# Patient Record
Sex: Female | Born: 1985 | Race: Black or African American | Hispanic: No | Marital: Single | Smoking: Never smoker
Health system: Southern US, Community
[De-identification: ages and names within clinical notes are randomized; demographics above are authoritative.]

## PROBLEM LIST (undated history)

## (undated) DIAGNOSIS — G43909 Migraine, unspecified, not intractable, without status migrainosus: Secondary | ICD-10-CM

## (undated) DIAGNOSIS — E66811 Obesity, class 1: Secondary | ICD-10-CM

## (undated) DIAGNOSIS — E7800 Pure hypercholesterolemia, unspecified: Secondary | ICD-10-CM

## (undated) DIAGNOSIS — R7303 Prediabetes: Secondary | ICD-10-CM

## (undated) DIAGNOSIS — E669 Obesity, unspecified: Secondary | ICD-10-CM

## (undated) HISTORY — DX: Obesity, unspecified: E66.9

## (undated) HISTORY — DX: Prediabetes: R73.03

## (undated) HISTORY — DX: Pure hypercholesterolemia, unspecified: E78.00

## (undated) HISTORY — PX: OTHER SURGICAL HISTORY: SHX169

## (undated) HISTORY — DX: Migraine, unspecified, not intractable, without status migrainosus: G43.909

## (undated) HISTORY — DX: Obesity, class 1: E66.811

---
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
## 2008-09-01 ENCOUNTER — Emergency Department (HOSPITAL_COMMUNITY): Admission: EM | Admit: 2008-09-01 | Discharge: 2008-09-01 | Payer: Self-pay | Admitting: Emergency Medicine

## 2010-05-14 ENCOUNTER — Emergency Department (HOSPITAL_COMMUNITY): Admission: EM | Admit: 2010-05-14 | Discharge: 2010-05-14 | Payer: Self-pay | Admitting: Emergency Medicine

## 2010-06-24 ENCOUNTER — Emergency Department (HOSPITAL_COMMUNITY): Admission: EM | Admit: 2010-06-24 | Discharge: 2010-06-24 | Payer: Self-pay | Admitting: Emergency Medicine
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
## 2010-10-22 ENCOUNTER — Emergency Department (HOSPITAL_COMMUNITY)
Admission: EM | Admit: 2010-10-22 | Discharge: 2010-10-22 | Payer: Self-pay | Source: Home / Self Care | Admitting: Family Medicine

## 2011-09-30 ENCOUNTER — Emergency Department (HOSPITAL_COMMUNITY)
Admission: EM | Admit: 2011-09-30 | Discharge: 2011-09-30 | Disposition: A | Discharge status: Home / Self Care | Payer: Self-pay | Attending: Emergency Medicine | Admitting: Emergency Medicine

## 2011-09-30 DIAGNOSIS — K0381 Cracked tooth: Secondary | ICD-10-CM | POA: Insufficient documentation

## 2011-09-30 DIAGNOSIS — S032XXA Dislocation of tooth, initial encounter: Secondary | ICD-10-CM

## 2011-09-30 DIAGNOSIS — K089 Disorder of teeth and supporting structures, unspecified: Secondary | ICD-10-CM | POA: Insufficient documentation

## 2011-09-30 DIAGNOSIS — K029 Dental caries, unspecified: Secondary | ICD-10-CM | POA: Insufficient documentation

## 2011-09-30 MED ORDER — AMOXICILLIN 500 MG PO CAPS: 500.0000 mg | ORAL_CAPSULE | Freq: Three times a day (TID) | ORAL | Status: AC

## 2011-09-30 MED ORDER — HYDROCODONE-ACETAMINOPHEN 5-325 MG PO TABS: 1.0000 | ORAL_TABLET | ORAL | Status: AC | PRN

## 2011-09-30 NOTE — ED Notes (Signed)
Rt. Facial swelling due to broken tooth.

## 2011-09-30 NOTE — ED Notes (Signed)
Pt here with c/o right facial swelling that started on Sunday.  Pt reports that she has a broken tooth on right lower side and wants to make sure that the area is not infected.  Pt reports the pain has improved and rates it an 8/10 at this time.

## 2011-09-30 NOTE — ED Provider Notes (Addendum)
History     CSN: [id number] Arrival date & time: 09/30/2011 11:41 AM   First MD Initiated Contact with Patient 09/30/11 1151      Chief Complaint  Patient presents with  . Dental Pain  ... complains of pain with broken tooth for several days. Has appointment with dentist. No fever, chills, stiff neck  (Consider location/radiation/quality/duration/timing/severity/associated sxs/prior treatment) HPI  History reviewed. No pertinent past medical history.  History reviewed. No pertinent past surgical history.  History reviewed. No pertinent family history.  History  Substance Use Topics  . Smoking status: Never Smoker   . Smokeless tobacco: Not on file  . Alcohol Use: Yes    OB History    Grav Para Term Preterm Abortions TAB SAB Ect Mult Living                  Review of Systems  All other systems reviewed and are negative.    Allergies  Review of patient's allergies indicates no known allergies.  Home Medications   Current Outpatient Rx  Name Route Sig Dispense Refill  . BENZOCAINE 10 % MT GEL Mouth/Throat Use as directed 1 application in the mouth or throat as needed. For tooth pain       BP 118/76  Pulse 96  Temp(Src) 98.9 F (37.2 C) (Oral)  Resp 16  Ht 5' (1.524 m)  Wt 165 lb (74.844 kg)  BMI 32.22 kg/m2  SpO2 99%  LMP 08/30/2011  Physical Exam  Nursing note and vitals reviewed. Constitutional: She is oriented to person, place, and time. She appears well-developed and well-nourished.  HENT:  Mouth/Throat: Dental caries present.         See diagram  Neck: Normal range of motion. Neck supple.  Neurological: She is alert and oriented to person, place, and time.  Skin: Skin is warm and dry.  Psychiatric: She has a normal mood and affect.    ED Course  Procedures (including critical care time)  Labs Reviewed - No data to display No results found.   No diagnosis found.    MDM  Rx antibiotics and pain meds. Refer to  dentist        Donnetta Hutching, MD 09/30/11 4098  Donnetta Hutching, MD 09/30/11 [phone]

## 2012-04-10 ENCOUNTER — Encounter (HOSPITAL_COMMUNITY): Payer: Self-pay | Admitting: Family Medicine

## 2012-04-10 ENCOUNTER — Emergency Department (HOSPITAL_COMMUNITY)
Admission: EM | Admit: 2012-04-10 | Discharge: 2012-04-10 | Disposition: A | Discharge status: Home / Self Care | Payer: Self-pay | Attending: Emergency Medicine | Admitting: Emergency Medicine

## 2012-04-10 DIAGNOSIS — F17200 Nicotine dependence, unspecified, uncomplicated: Secondary | ICD-10-CM | POA: Insufficient documentation

## 2012-04-10 DIAGNOSIS — B356 Tinea cruris: Secondary | ICD-10-CM

## 2012-04-10 MED ORDER — KETOCONAZOLE 2 % EX CREA
TOPICAL_CREAM | Freq: Two times a day (BID) | CUTANEOUS | Status: DC
  Administered 2012-04-10: 1 via TOPICAL
  Filled 2012-04-10: qty 15

## 2012-04-10 NOTE — ED Provider Notes (Signed)
History    This chart was scribed for Gavin Pound. Oletta Lamas, MD, MD by Smitty Pluck. The patient was seen in room TR04C and the patient's care was started at 2:06PM.   CSN: [id number]  Arrival date & time 04/10/12  1328   None     Chief Complaint  Patient presents with  . Rash    (Consider location/radiation/quality/duration/timing/severity/associated sxs/prior treatment) The history is provided by the patient.   Tracy Johnson is a 26 y.o. female who presents to the Emergency Department complaining of moderate rash in pelvic region onset 1 day ago. Pt denies itching and burning. Pt denies dysuria. Pt reports LMP was 02/21/12. Denies any other problems. Reports that she has tried new soap but rash is not present any other place on body.   History reviewed. No pertinent past medical history.  History reviewed. No pertinent past surgical history.  History reviewed. No pertinent family history.  History  Substance Use Topics  . Smoking status: Passive Smoker  . Smokeless tobacco: Not on file  . Alcohol Use: Yes    OB History    Grav Para Term Preterm Abortions TAB SAB Ect Mult Living                  Review of Systems  Constitutional: Negative for fever and chills.  HENT: Negative for congestion and rhinorrhea.   Gastrointestinal: Negative for nausea, vomiting and diarrhea.  Genitourinary: Negative for dysuria, hematuria, flank pain, vaginal bleeding, vaginal discharge, difficulty urinating and pelvic pain.  Skin: Positive for rash. Negative for color change and wound.    Allergies  Review of patient's allergies indicates no known allergies.  Home Medications  No current outpatient prescriptions on file.  BP 121/63  Pulse 89  Temp 98 F (36.7 C)  Resp 16  SpO2 98%  LMP 03/23/2012  Physical Exam  Nursing note and vitals reviewed. Constitutional: She is oriented to person, place, and time. She appears well-developed and well-nourished. No distress.  HENT:  Head:  Normocephalic and atraumatic.  Pulmonary/Chest: Effort normal. No respiratory distress.  Abdominal: Soft.  Genitourinary:       Right inguinal crease skin breakdown, slightly moist  Erythematous but not warm No pus drainage or fluctuance No lymphadenopathy   Neurological: She is alert and oriented to person, place, and time.  Skin: Skin is warm and dry. Rash noted.  Psychiatric: She has a normal Johnson and affect. Her behavior is normal.    ED Course  Procedures (including critical care time) DIAGNOSTIC STUDIES: Oxygen Saturation is 98% on room air, normal by my interpretation.    COORDINATION OF CARE: 2:10PM EDP discusses pt ED treatment with pt.    Labs Reviewed - No data to display No results found.   1. Tinea cruris       MDM  I personally performed the services described in this documentation, which was scribed in my presence. The recorded information has been reviewed and considered.         Gavin Pound. Iokepa Geffre, MD 04/10/12 1527

## 2012-04-10 NOTE — ED Notes (Signed)
Pt sts rash in pelvic region that she noticed yesterday. sts only on one side. Pt sts she did try a new body wash recently.

## 2012-04-10 NOTE — Discharge Instructions (Signed)
Jock Itch Jock itch is a fungal infection of the skin in the groin area. It is sometimes called "ringworm" even though it is not caused by a worm. A fungus is a type of germ that thrives in dark, damp places.  CAUSES  This infection may spread from:  A fungus infection elsewhere on the body (such as athlete's foot).   Sharing towels or clothing.  This infection is more common in:  Hot, humid climates.   People who wear tight-fitting clothing or wet bathing suits for long periods of time.   Athletes.   Overweight people.   People with diabetes.  SYMPTOMS  Jock itch causes the following symptoms:  Red, pink or brown rash in the groin. Rash may spread to the thighs, anus, and buttocks.   Itching.  DIAGNOSIS  Your caregiver may make the diagnosis by looking at the rash. Sometimes a skin scraping will be sent to test for fungus. Testing can be done either by looking under the microscope or by doing a culture (test to try to grow the fungus). A culture can take up to 2 weeks to come back. TREATMENT  Jock itch may be treated with:  Skin cream or ointment to kill fungus.   Medicine by mouth to kill fungus.   Skin cream or ointment to calm the itching.   Compresses or medicated powders to dry the infected skin.  HOME CARE INSTRUCTIONS   Be sure to treat the rash completely. Follow your caregiver's instructions. It can take a couple of weeks to treat. If you do not treat the infection long enough, the rash can come back.   Wear loose-fitting clothing.   Men should wear cotton boxer shorts.   Women should wear cotton underwear.   Avoid hot baths.   Dry the groin area well after bathing.  SEEK MEDICAL CARE IF:   Your rash is worse.   Your rash is spreading.   Your rash returns after treatment is finished.   Your rash is not gone in 4 weeks. Fungal infections are slow to respond to treatment. Some redness may remain for several weeks after the fungus is gone.  SEEK  IMMEDIATE MEDICAL CARE IF:  The area becomes red, warm, tender, and swollen.   You have a fever.  Document Released: 09/19/2002 Document Revised: 09/18/2011 Document Reviewed: 08/18/2008 [employer] Patient Information 2012 [employer]. 

## 2012-07-23 ENCOUNTER — Encounter (HOSPITAL_COMMUNITY): Payer: Self-pay | Admitting: *Deleted

## 2012-07-23 ENCOUNTER — Emergency Department (HOSPITAL_COMMUNITY): Payer: Self-pay

## 2012-07-23 ENCOUNTER — Emergency Department (HOSPITAL_COMMUNITY)
Admission: EM | Admit: 2012-07-23 | Discharge: 2012-07-23 | Disposition: A | Discharge status: Home / Self Care | Payer: Self-pay | Attending: Emergency Medicine | Admitting: Emergency Medicine

## 2012-07-23 DIAGNOSIS — W230XXA Caught, crushed, jammed, or pinched between moving objects, initial encounter: Secondary | ICD-10-CM | POA: Insufficient documentation

## 2012-07-23 DIAGNOSIS — Y92009 Unspecified place in unspecified non-institutional (private) residence as the place of occurrence of the external cause: Secondary | ICD-10-CM | POA: Insufficient documentation

## 2012-07-23 DIAGNOSIS — S6990XA Unspecified injury of unspecified wrist, hand and finger(s), initial encounter: Secondary | ICD-10-CM | POA: Insufficient documentation

## 2012-07-23 DIAGNOSIS — S60029A Contusion of unspecified index finger without damage to nail, initial encounter: Secondary | ICD-10-CM

## 2012-07-23 DIAGNOSIS — S6980XA Other specified injuries of unspecified wrist, hand and finger(s), initial encounter: Secondary | ICD-10-CM | POA: Insufficient documentation

## 2012-07-23 NOTE — ED Provider Notes (Signed)
History     CSN: [id number]  Arrival date & time 07/23/12  1308   First MD Initiated Contact with Patient 07/23/12 [phone]      Chief Complaint  Patient presents with  . Hand Injury    (Consider location/radiation/quality/duration/timing/severity/associated sxs/prior treatment) Patient is a 26 y.o. female presenting with hand injury. The history is provided by the patient.  Hand Injury  Incident onset: 2 days ago. The incident occurred at home. Injury mechanism: door closed on the left index finger. The quality of the pain is described as throbbing. The pain is moderate. The pain has been constant since the incident. The symptoms are aggravated by movement, use and palpation. She has tried ice for the symptoms. The treatment provided no relief.    History reviewed. No pertinent past medical history.  History reviewed. No pertinent past surgical history.  History reviewed. No pertinent family history.  History  Substance Use Topics  . Smoking status: Passive Smoke Exposure - Never Smoker  . Smokeless tobacco: Not on file  . Alcohol Use: Yes    OB History    Grav Para Term Preterm Abortions TAB SAB Ect Mult Living                  Review of Systems  All other systems reviewed and are negative.    Allergies  Review of patient's allergies indicates no known allergies.  Home Medications  No current outpatient prescriptions on file.  BP 128/73  Pulse 87  Temp 98.3 F (36.8 C) (Oral)  Resp 16  SpO2 99%  LMP 06/28/2012  Physical Exam  Nursing note and vitals reviewed. Constitutional: She is oriented to person, place, and time. She appears well-developed and well-nourished.  HENT:  Head: Normocephalic and atraumatic.  Neck: Normal range of motion. Neck supple.  Musculoskeletal:       The left index finger appears grossly normal.  There is no swelling.  There is pain with range of motion and palpation.    Neurological: She is alert and oriented to person, place,  and time.  Skin: Skin is warm and dry.    ED Course  Procedures (including critical care time)  Labs Reviewed - No data to display Dg Hand Complete Left  07/23/2012  *RADIOLOGY REPORT*  Clinical Data: 26 year old female status post blunt trauma with pain.  LEFT HAND - COMPLETE 3+ VIEW  Comparison: None.  Findings: Bone mineralization is within normal limits.  Distal radius and ulna intact.  Carpal bone alignment within normal limits.  Joint spaces preserved.  No fracture or dislocation.  IMPRESSION: No acute fracture or dislocation identified about the left hand.   Original Report Authenticated By: Harley Hallmark, M.D.      No diagnosis found.    MDM  Will treat with a splint, rest, follow up prn if not improving.          Geoffery Lyons, MD 07/23/12 [phone]

## 2012-07-23 NOTE — ED Notes (Signed)
The patient is AOx4 and comfortable with her discharge instructions. 

## 2012-07-23 NOTE — ED Notes (Signed)
Patient transported to X-ray 

## 2012-07-23 NOTE — ED Notes (Signed)
Pt states a couple of days ago she caught her left pointer finger in a swinging door. Pt states she tried ice and OTC pain medications and the swelling has gone down but when she tried to bend her finger she has severe pain. CNS intact cap refill less than 3 sec

## 2012-11-06 ENCOUNTER — Emergency Department (HOSPITAL_COMMUNITY)
Admission: EM | Admit: 2012-11-06 | Discharge: 2012-11-06 | Disposition: A | Discharge status: Home / Self Care | Payer: BC Managed Care – PPO | Attending: Emergency Medicine | Admitting: Emergency Medicine

## 2012-11-06 ENCOUNTER — Encounter (HOSPITAL_COMMUNITY): Payer: Self-pay | Admitting: Neurology

## 2012-11-06 DIAGNOSIS — K089 Disorder of teeth and supporting structures, unspecified: Secondary | ICD-10-CM | POA: Insufficient documentation

## 2012-11-06 DIAGNOSIS — N63 Unspecified lump in unspecified breast: Secondary | ICD-10-CM | POA: Insufficient documentation

## 2012-11-06 MED ORDER — CEPHALEXIN 500 MG PO CAPS: 500.0000 mg | ORAL_CAPSULE | Freq: Two times a day (BID) | ORAL | Status: DC

## 2012-11-06 MED ORDER — HYDROCODONE-ACETAMINOPHEN 5-325 MG PO TABS
1.0000 | ORAL_TABLET | Freq: Once | ORAL | Status: AC
  Administered 2012-11-06: 1 via ORAL
  Filled 2012-11-06: qty 1

## 2012-11-06 MED ORDER — HYDROCODONE-ACETAMINOPHEN 5-325 MG PO TABS: 2.0000 | ORAL_TABLET | ORAL | Status: DC | PRN

## 2012-11-06 MED ORDER — CEPHALEXIN 250 MG PO CAPS
500.0000 mg | ORAL_CAPSULE | Freq: Once | ORAL | Status: AC
  Administered 2012-11-06: 500 mg via ORAL
  Filled 2012-11-06: qty 2

## 2012-11-06 NOTE — ED Notes (Signed)
No answer x2 

## 2012-11-06 NOTE — ED Notes (Signed)
Pt reports bump to right breast,  Right lower toothache. Bump been present for several weeks, toothache just occurred. Pt in NAD

## 2012-11-06 NOTE — ED Provider Notes (Addendum)
History     CSN: [id number]  Arrival date & time 11/06/12  1118   First MD Initiated Contact with Patient 11/06/12 1130      No chief complaint on file.  Chief complaint "I have a knot on my breast" (Consider location/radiation/quality/duration/timing/severity/associated sxs/prior treatment) HPI Complains of left upper toothache at left upper premolar for possibly one week which has improved, without treatment. Patient also complains of a painful "knot" on her right breast for approximately one week. Pain is mild she's been treating herself with Tylenol, without relief no fever no shortness of breath pain is worse with wearing a bra improved with taking cough no fever no shortness of breath no other associated symptoms No past medical history on file. Past medical history negative No past surgical history on file.  No family history on file.  History  Substance Use Topics  . Smoking status: Passive Smoke Exposure - Never Smoker  . Smokeless tobacco: Not on file  . Alcohol Use: Yes    OB History    Grav Para Term Preterm Abortions TAB SAB Ect Mult Living                  Review of Systems  Constitutional: Negative.   HENT: Positive for dental problem.   Respiratory: Negative.   Cardiovascular: Negative.   Gastrointestinal: Negative.   Musculoskeletal: Negative.   Skin: Positive for wound.       Pain at right breast  Neurological: Negative.   Hematological: Negative.   Psychiatric/Behavioral: Negative.   All other systems reviewed and are negative.    Allergies  Review of patient's allergies indicates no known allergies.  Home Medications  No current outpatient prescriptions on file.  BP 127/76  Pulse 97  Temp 98.9 F (37.2 C) (Oral)  Resp 18  SpO2 100%  Physical Exam  Nursing note and vitals reviewed. Constitutional: She is oriented to person, place, and time. She appears well-developed and well-nourished. No distress.  HENT:  Head: Normocephalic and  atraumatic.  Right Ear: External ear normal.  Left Ear: External ear normal.       Left upper pre-molar with dental Carry mildly tender. No fluctuance or swelling of the gingiva. No trismus  Eyes: Conjunctivae normal are normal. Pupils are equal, round, and reactive to light.  Neck: Neck supple. No tracheal deviation present. No thyromegaly present.  Cardiovascular: Normal rate and regular rhythm.   No murmur heard. Pulmonary/Chest: Effort normal and breath sounds normal.        lower medial quadrant of right breast has a pea-sized tender nodule which is easily movable. No axillary nodes nipple is normal  Abdominal: Soft. Bowel sounds are normal. She exhibits no distension. There is no tenderness.  Musculoskeletal: Normal range of motion. She exhibits no edema and no tenderness.  Lymphadenopathy:    She has no cervical adenopathy.  Neurological: She is alert and oriented to person, place, and time. Coordination normal.  Skin: Skin is warm and dry. No rash noted.  Psychiatric: She has a normal mood and affect.    ED Course  Procedures (including critical care time)  Labs Reviewed - No data to display No results found.   No diagnosis found.    MDM  I suspect patient has a small breast abscess. Case discussed with Dr. Eppie Gibson plan Prescription Keflex, Vicodin. Patient breast center 11/08/2012 for followup She has a scheduled appointment with her dentist for 11/29/2012 Diagnosis #1 breast mass #2 dental carry  Doug Sou, MD 11/06/12 1154  Doug Sou, MD 11/06/12 1212

## 2012-11-08 ENCOUNTER — Other Ambulatory Visit: Payer: Self-pay | Admitting: Emergency Medicine

## 2012-11-08 ENCOUNTER — Ambulatory Visit
Admission: RE | Admit: 2012-11-08 | Discharge: 2012-11-08 | Disposition: A | Discharge status: Home / Self Care | Payer: BC Managed Care – PPO | Source: Ambulatory Visit | Attending: Emergency Medicine | Admitting: Emergency Medicine

## 2012-11-08 DIAGNOSIS — N63 Unspecified lump in unspecified breast: Secondary | ICD-10-CM

## 2012-11-08 DIAGNOSIS — N644 Mastodynia: Secondary | ICD-10-CM

## 2012-11-22 ENCOUNTER — Other Ambulatory Visit: Payer: Self-pay | Admitting: Internal Medicine

## 2012-11-22 DIAGNOSIS — N63 Unspecified lump in unspecified breast: Secondary | ICD-10-CM

## 2012-11-22 DIAGNOSIS — N644 Mastodynia: Secondary | ICD-10-CM

## 2012-12-31 ENCOUNTER — Emergency Department (HOSPITAL_COMMUNITY)
Admission: EM | Admit: 2012-12-31 | Discharge: 2012-12-31 | Disposition: A | Discharge status: Home / Self Care | Payer: BC Managed Care – PPO | Attending: Emergency Medicine | Admitting: Emergency Medicine

## 2012-12-31 ENCOUNTER — Encounter (HOSPITAL_COMMUNITY): Payer: Self-pay

## 2012-12-31 DIAGNOSIS — J029 Acute pharyngitis, unspecified: Secondary | ICD-10-CM

## 2012-12-31 DIAGNOSIS — J3489 Other specified disorders of nose and nasal sinuses: Secondary | ICD-10-CM | POA: Insufficient documentation

## 2012-12-31 NOTE — ED Notes (Signed)
Pt presents onset of sore throat x 2 days that pt describes as "itchy" but awoke this morning with "burning" in throat.  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 Pt reports "stuffy" nose.

## 2012-12-31 NOTE — ED Provider Notes (Signed)
History     CSN: [id number]  Arrival date & time 12/31/12  1127   First MD Initiated Contact with Patient 12/31/12 1243      No chief complaint on file.   (Consider location/radiation/quality/duration/timing/severity/associated sxs/prior treatment) Patient is a 27 y.o. female presenting with pharyngitis. The history is provided by the patient.  Sore Throat This is a new problem. Episode onset: two days. The problem occurs constantly. The problem has been gradually worsening. Associated symptoms include congestion and a sore throat. Pertinent negatives include no abdominal pain, anorexia, chills, coughing, fever, neck pain, rash, swollen glands or vomiting. The symptoms are aggravated by swallowing. She has tried nothing for the symptoms.    History reviewed. No pertinent past medical history.  History reviewed. No pertinent past surgical history.  History reviewed. No pertinent family history.  History  Substance Use Topics  . Smoking status: Passive Smoke Exposure - Never Smoker  . Smokeless tobacco: Not on file  . Alcohol Use: Yes    OB History   Grav Para Term Preterm Abortions TAB SAB Ect Mult Living                  Review of Systems  Constitutional: Negative for fever and chills.  HENT: Positive for congestion and sore throat. Negative for drooling, trouble swallowing, neck pain, voice change, postnasal drip and sinus pressure.   Respiratory: Negative for cough.   Gastrointestinal: Negative for vomiting, abdominal pain and anorexia.  Skin: Negative for rash.    Allergies  Review of patient's allergies indicates no known allergies.  Home Medications  No current outpatient prescriptions on file.  BP 110/60  Pulse 71  Temp(Src) 98.3 F (36.8 C) (Oral)  Resp 14  SpO2 98%  LMP 12/06/2012  Physical Exam  Nursing note and vitals reviewed. Constitutional: She appears well-developed and well-nourished. No distress.  HENT:  Head: Normocephalic and  atraumatic. No trismus in the jaw.  Right Ear: Tympanic membrane, external ear and ear canal normal.  Left Ear: Tympanic membrane, external ear and ear canal normal.  Nose: Mucosal edema and rhinorrhea present. Right sinus exhibits no maxillary sinus tenderness and no frontal sinus tenderness. Left sinus exhibits no maxillary sinus tenderness and no frontal sinus tenderness.  Mouth/Throat: Uvula is midline and mucous membranes are normal. No edematous. Posterior oropharyngeal erythema present. No oropharyngeal exudate, posterior oropharyngeal edema or tonsillar abscesses.  Patient handling secretions well.  No drooling.  Normal voice phonation  Neck: Normal range of motion. Neck supple.  Cardiovascular: Normal rate, regular rhythm and normal heart sounds.   Pulmonary/Chest: Effort normal and breath sounds normal.  Lymphadenopathy:    She has no cervical adenopathy.  Neurological: She is alert.  Skin: Skin is warm and dry. She is not diaphoretic.  Psychiatric: She has a normal mood and affect.    ED Course  Procedures (including critical care time)  Labs Reviewed  RAPID STREP SCREEN   No results found.   1. Viral pharyngitis       MDM  Patient presenting with sore throat x 2 days.  Rapid strep negative.   Uvula midline.  No evidence of peritonsillar abscess.  Patient discharged home.  Return precautions given.        Pascal Lux Cash, PA-C 12/31/12 [phone]

## 2012-12-31 NOTE — ED Provider Notes (Signed)
Medical screening examination/treatment/procedure(s) were performed by non-physician practitioner and as supervising physician I was immediately available for consultation/collaboration.  Flint Melter, MD 12/31/12 [phone]

## 2013-02-22 ENCOUNTER — Emergency Department (HOSPITAL_COMMUNITY)
Admission: EM | Admit: 2013-02-22 | Discharge: 2013-02-22 | Disposition: A | Discharge status: Home / Self Care | Payer: BC Managed Care – PPO | Source: Home / Self Care | Attending: Emergency Medicine | Admitting: Emergency Medicine

## 2013-02-22 ENCOUNTER — Encounter (HOSPITAL_COMMUNITY): Payer: Self-pay | Admitting: *Deleted

## 2013-02-22 ENCOUNTER — Other Ambulatory Visit (HOSPITAL_COMMUNITY)
Admission: RE | Admit: 2013-02-22 | Discharge: 2013-02-22 | Disposition: A | Discharge status: Home / Self Care | Payer: BC Managed Care – PPO | Source: Ambulatory Visit | Attending: Emergency Medicine | Admitting: Emergency Medicine

## 2013-02-22 DIAGNOSIS — N760 Acute vaginitis: Secondary | ICD-10-CM

## 2013-02-22 DIAGNOSIS — Z113 Encounter for screening for infections with a predominantly sexual mode of transmission: Secondary | ICD-10-CM | POA: Insufficient documentation

## 2013-02-22 LAB — POCT URINALYSIS DIP (DEVICE)
Nitrite: NEGATIVE
Protein, ur: NEGATIVE mg/dL
pH: 6 (ref 5.0–8.0)

## 2013-02-22 MED ORDER — FLUCONAZOLE 150 MG PO TABS: 150.0000 mg | ORAL_TABLET | Freq: Once | ORAL | Status: DC

## 2013-02-22 NOTE — ED Notes (Signed)
Call back number for lab issues verified 

## 2013-02-22 NOTE — ED Notes (Signed)
Pt  Reports  A  White      Vaginal   Discharge   And  Itching         -      She  Tried  Monistat          Which  Did        Not  Clear   No  Pain      No  Bleeding          Frequent  Urination

## 2013-02-22 NOTE — ED Provider Notes (Signed)
Chief Complaint:   Chief Complaint  Patient presents with  . Vaginal Discharge    History of Present Illness:   Tracy Johnson is a 27 year old female who has had a one half week history of a whitish, itchy vaginal discharge. She denies any odor. There is no vulvar, vaginal, or pelvic pain. She tried Monistat without any improvement. She has a history of yeast infections. Her menses have been irregular, last menstrual period being the end of April. She is sexually active with consistent use of condoms. She denies fever, chills, nausea, vomiting, or urinary symptoms. No prior history of GYN problems or STDs.  Review of Systems:  Other than noted above, the patient denies any of the following symptoms: Systemic:  No fever, chills, sweats, or weight loss. GI:  No abdominal pain, nausea, anorexia, vomiting, diarrhea, constipation, melena or hematochezia. GU:  No dysuria, frequency, urgency, hematuria, vaginal discharge, itching, or abnormal vaginal bleeding. Skin:  No rash or itching.  PMFSH:  Past medical history, family history, social history, meds, and allergies were reviewed.   Physical Exam:   Vital signs:  BP 130/76  Pulse 72  Temp(Src) 98.6 F (37 C) (Oral)  SpO2 100%  LMP 02/11/2013 General:  Alert, oriented and in no distress. Lungs:  Breath sounds clear and equal bilaterally.  No wheezes, rales or rhonchi. Heart:  Regular rhythm.  No gallops or murmers. Abdomen:  Soft, flat and non-distended.  No organomegaly or mass.  No tenderness, guarding or rebound.  Bowel sounds normally active. Pelvic exam:  Normal external genitalia, there is a scant, whitish vaginal discharge without any odor. No pain on cervical motion. Uterus was normal in size and shape and nontender. No adnexal masses or tenderness. Skin:  Clear, warm and dry.  Labs:   Results for orders placed during the hospital encounter of 02/22/13  POCT URINALYSIS DIP (DEVICE)      Result Value Range   Glucose, UA NEGATIVE   NEGATIVE mg/dL   Bilirubin Urine NEGATIVE  NEGATIVE   Ketones, ur NEGATIVE  NEGATIVE mg/dL   Specific Gravity, Urine 1.025  1.005 - 1.030   Hgb urine dipstick TRACE (*) NEGATIVE   pH 6.0  5.0 - 8.0   Protein, ur NEGATIVE  NEGATIVE mg/dL   Urobilinogen, UA 0.2  0.0 - 1.0 mg/dL   Nitrite NEGATIVE  NEGATIVE   Leukocytes, UA SMALL (*) NEGATIVE  POCT PREGNANCY, URINE      Result Value Range   Preg Test, Ur NEGATIVE  NEGATIVE    DNA probes were obtained for gonorrhea, Chlamydia, Trichomonas, Gardnerella, Candida. Results are pending at this time and we'll call patient about any positive results.  Assessment:  The encounter diagnosis was Vaginitis.  Probably Candida.  Plan:   1.  The following meds were prescribed:   Discharge Medication List as of 02/22/2013 11:33 AM    START taking these medications   Details  fluconazole (DIFLUCAN) 150 MG tablet Take 1 tablet (150 mg total) by mouth once., Starting 02/22/2013, Normal       2.  The patient was instructed in symptomatic care and handouts were given. 3.  The patient was told to return if becoming worse in any way, if no better in 3 or 4 days, and given some red flag symptoms such as pelvic pain or fever that would indicate earlier return. 4.  Follow up  Here if no better in one to 2 weeks.    Reuben Likes, MD 02/22/13 2135

## 2013-02-24 ENCOUNTER — Telehealth (HOSPITAL_COMMUNITY): Payer: Self-pay | Admitting: Emergency Medicine

## 2013-02-24 MED ORDER — METRONIDAZOLE 500 MG PO TABS: 500.0000 mg | ORAL_TABLET | Freq: Two times a day (BID) | ORAL | Status: DC

## 2013-02-24 NOTE — ED Notes (Signed)
Her DNA probe was positive for Trichomonas, all others were negative. Will treat with metronidazole 500 mg twice a day for one week.  Reuben Likes, MD 02/24/13 1640

## 2013-02-24 NOTE — Telephone Encounter (Signed)
Message copied by Reuben Likes on Thu Feb 24, 2013  4:40 PM ------      Message from: Vassie Moselle      Created: Thu Feb 24, 2013  3:22 PM      Regarding: labs       Trich pos. Rest of labs neg. Needs treatment.      Vassie Moselle      02/24/2013       ------

## 2013-02-25 ENCOUNTER — Telehealth (HOSPITAL_COMMUNITY): Payer: Self-pay | Admitting: *Deleted

## 2013-02-25 NOTE — ED Notes (Signed)
I called pt. Pt. verified x 2 and given results.  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.  Pt. instructed  to notify her partner to be treated with Flagyl, no sex until you have finished your medication and your partner has been treated and to practice safe sex.  Pt. instructed to no alcohol while taking this medication.  Pt. called back a few minutes later and asked if her partner is supposed to take some of her medication.  I told her no, he needs to see the doctor, tell him he was exposed to trich and get his own Rx. Pt. voiced understanding. Vassie Moselle 02/25/2013

## 2013-02-28 ENCOUNTER — Other Ambulatory Visit: Payer: Self-pay | Admitting: Obstetrics and Gynecology

## 2013-08-11 ENCOUNTER — Encounter (HOSPITAL_COMMUNITY): Payer: Self-pay | Admitting: Emergency Medicine

## 2013-08-11 ENCOUNTER — Emergency Department (HOSPITAL_COMMUNITY)
Admission: EM | Admit: 2013-08-11 | Discharge: 2013-08-12 | Disposition: A | Discharge status: Home / Self Care | Payer: BC Managed Care – PPO | Attending: Emergency Medicine | Admitting: Emergency Medicine

## 2013-08-11 DIAGNOSIS — Z3202 Encounter for pregnancy test, result negative: Secondary | ICD-10-CM | POA: Insufficient documentation

## 2013-08-11 DIAGNOSIS — Z79899 Other long term (current) drug therapy: Secondary | ICD-10-CM | POA: Insufficient documentation

## 2013-08-11 DIAGNOSIS — M5430 Sciatica, unspecified side: Secondary | ICD-10-CM | POA: Insufficient documentation

## 2013-08-11 DIAGNOSIS — M5431 Sciatica, right side: Secondary | ICD-10-CM

## 2013-08-11 DIAGNOSIS — IMO0002 Reserved for concepts with insufficient information to code with codable children: Secondary | ICD-10-CM | POA: Insufficient documentation

## 2013-08-11 NOTE — ED Notes (Signed)
R flank pain radiating to R thigh for several days

## 2013-08-12 LAB — URINALYSIS, ROUTINE W REFLEX MICROSCOPIC
Bilirubin Urine: NEGATIVE
Hgb urine dipstick: NEGATIVE
Protein, ur: NEGATIVE mg/dL
Urobilinogen, UA: 1 mg/dL (ref 0.0–1.0)

## 2013-08-12 MED ORDER — PREDNISONE 20 MG PO TABS
60.0000 mg | ORAL_TABLET | Freq: Once | ORAL | Status: AC
  Administered 2013-08-12: 60 mg via ORAL
  Filled 2013-08-12: qty 3

## 2013-08-12 MED ORDER — PREDNISONE 20 MG PO TABS: 60.0000 mg | ORAL_TABLET | Freq: Every day | ORAL | Status: DC

## 2013-08-12 MED ORDER — CYCLOBENZAPRINE HCL 10 MG PO TABS: 10.0000 mg | ORAL_TABLET | Freq: Two times a day (BID) | ORAL | Status: DC | PRN

## 2013-08-12 NOTE — ED Notes (Addendum)
Pt reports lower back pain radiating to R upper leg for several days, denies injury or strain. Denies history of UTI, still has appendix.

## 2013-08-12 NOTE — ED Provider Notes (Signed)
Medical screening examination/treatment/procedure(s) were performed by non-physician practitioner and as supervising physician I was immediately available for consultation/collaboration.    Jestine Bicknell M Lalitha Ilyas, MD 08/12/13 0700 

## 2013-08-12 NOTE — ED Provider Notes (Signed)
CSN: [id number]     Arrival date & time 08/11/13  2350 History   None    Chief Complaint  Patient presents with  . Back Pain   (Consider location/radiation/quality/duration/timing/severity/associated sxs/prior Treatment) HPI Comments: Patient is a 27 year old female presented to the emergency department for 3 days gradually worsening right-sided lower back pain with radiation down right leg. She describes her pain as sharp and burning in nature. Patient states her pain began at work while lifting a heavy object. Patient states she's tried symptomatic care such as warm baths stretching with minimal relief. She denies any aggravating factors. She denies any fevers, chills, bladder bowel incontinence, numbness or weakness, history of cancer, history of IV drug abuse.  Patient is a 27 y.o. female presenting with back pain.  Back Pain   History reviewed. No pertinent past medical history. History reviewed. No pertinent past surgical history. History reviewed. No pertinent family history. History  Substance Use Topics  . Smoking status: Passive Smoke Exposure - Never Smoker  . Smokeless tobacco: Not on file  . Alcohol Use: Yes   OB History   Grav Para Term Preterm Abortions TAB SAB Ect Mult Living                 Review of Systems  Musculoskeletal: Positive for back pain.  All other systems reviewed and are negative.    Allergies  Review of patient's allergies indicates no known allergies.  Home Medications   Current Outpatient Rx  Name  Route  Sig  Dispense  Refill  . GILDESS FE 1/20 1-20 MG-MCG tablet   Oral   Take 1 tablet by mouth daily.         . cyclobenzaprine (FLEXERIL) 10 MG tablet   Oral   Take 1 tablet (10 mg total) by mouth 2 (two) times daily as needed for muscle spasms.   20 tablet   0   . predniSONE (DELTASONE) 20 MG tablet   Oral   Take 3 tablets (60 mg total) by mouth daily.   12 tablet   0    BP 128/84  Pulse 78  Temp(Src) 97.2 F (36.2 C)  (Oral)  Resp 14  SpO2 100%  LMP 07/24/2013 Physical Exam  Constitutional: She is oriented to person, place, and time. She appears well-developed and well-nourished. No distress.  HENT:  Head: Normocephalic and atraumatic.  Right Ear: External ear normal.  Left Ear: External ear normal.  Nose: Nose normal.  Eyes: Conjunctivae and EOM are normal. Pupils are equal, round, and reactive to light.  Neck: Neck supple.  Cardiovascular: Normal rate, regular rhythm, normal heart sounds and intact distal pulses.   Pulmonary/Chest: Effort normal and breath sounds normal.  Abdominal: Soft. Bowel sounds are normal. She exhibits no distension. There is no tenderness. There is no rigidity, no rebound, no guarding and no CVA tenderness.  Musculoskeletal:       Cervical back: Normal.       Thoracic back: Normal.       Lumbar back: She exhibits tenderness and spasm. She exhibits normal range of motion, no bony tenderness, no swelling, no edema, no deformity, no laceration, no pain and normal pulse.  Neurological: She is alert and oriented to person, place, and time. She has normal strength. No cranial nerve deficit or sensory deficit. Gait normal. GCS eye subscore is 4. GCS verbal subscore is 5. GCS motor subscore is 6.  No pronator drift. Bilateral heel-knee-shin intact.  Skin: Skin is warm and dry.  She is not diaphoretic.    ED Course  Procedures (including critical care time) Medications  predniSONE (DELTASONE) tablet 60 mg (60 mg Oral Given 08/12/13 0129)    Labs Review Labs Reviewed  URINALYSIS, ROUTINE W REFLEX MICROSCOPIC - Abnormal; Notable for the following:    APPearance CLOUDY (*)    All other components within normal limits  POCT PREGNANCY, URINE   Imaging Review No results found.  EKG Interpretation   None       MDM   1. Sciatica, right    Afebrile, NAD, non-toxic appearing, AAOx4. Patient with back pain.  No neurological deficits and normal neuro exam.  Patient can walk  but states is painful.  No loss of bowel or bladder control.  No concern for cauda equina.  No fever, night sweats, weight loss, h/o cancer, IVDU.  RICE protocol and pain medicine indicated and discussed with patient. Return precautions discussed. Patient is agreeable to plan. Patient is stable at time of discharge        Jeannetta Ellis, PA-C 08/12/13 4098

## 2014-04-25 ENCOUNTER — Other Ambulatory Visit: Payer: Self-pay | Admitting: Obstetrics and Gynecology

## 2014-04-26 LAB — CYTOLOGY - PAP

## 2014-05-28 IMAGING — CR DG HAND COMPLETE 3+V*L*
3 series · 3 of 3 positions shown · non-contrast
Comparison: None.

CLINICAL DATA: 26-year-old female status post blunt trauma with
pain.

LEFT HAND - COMPLETE 3+ VIEW

[x hand pa left]
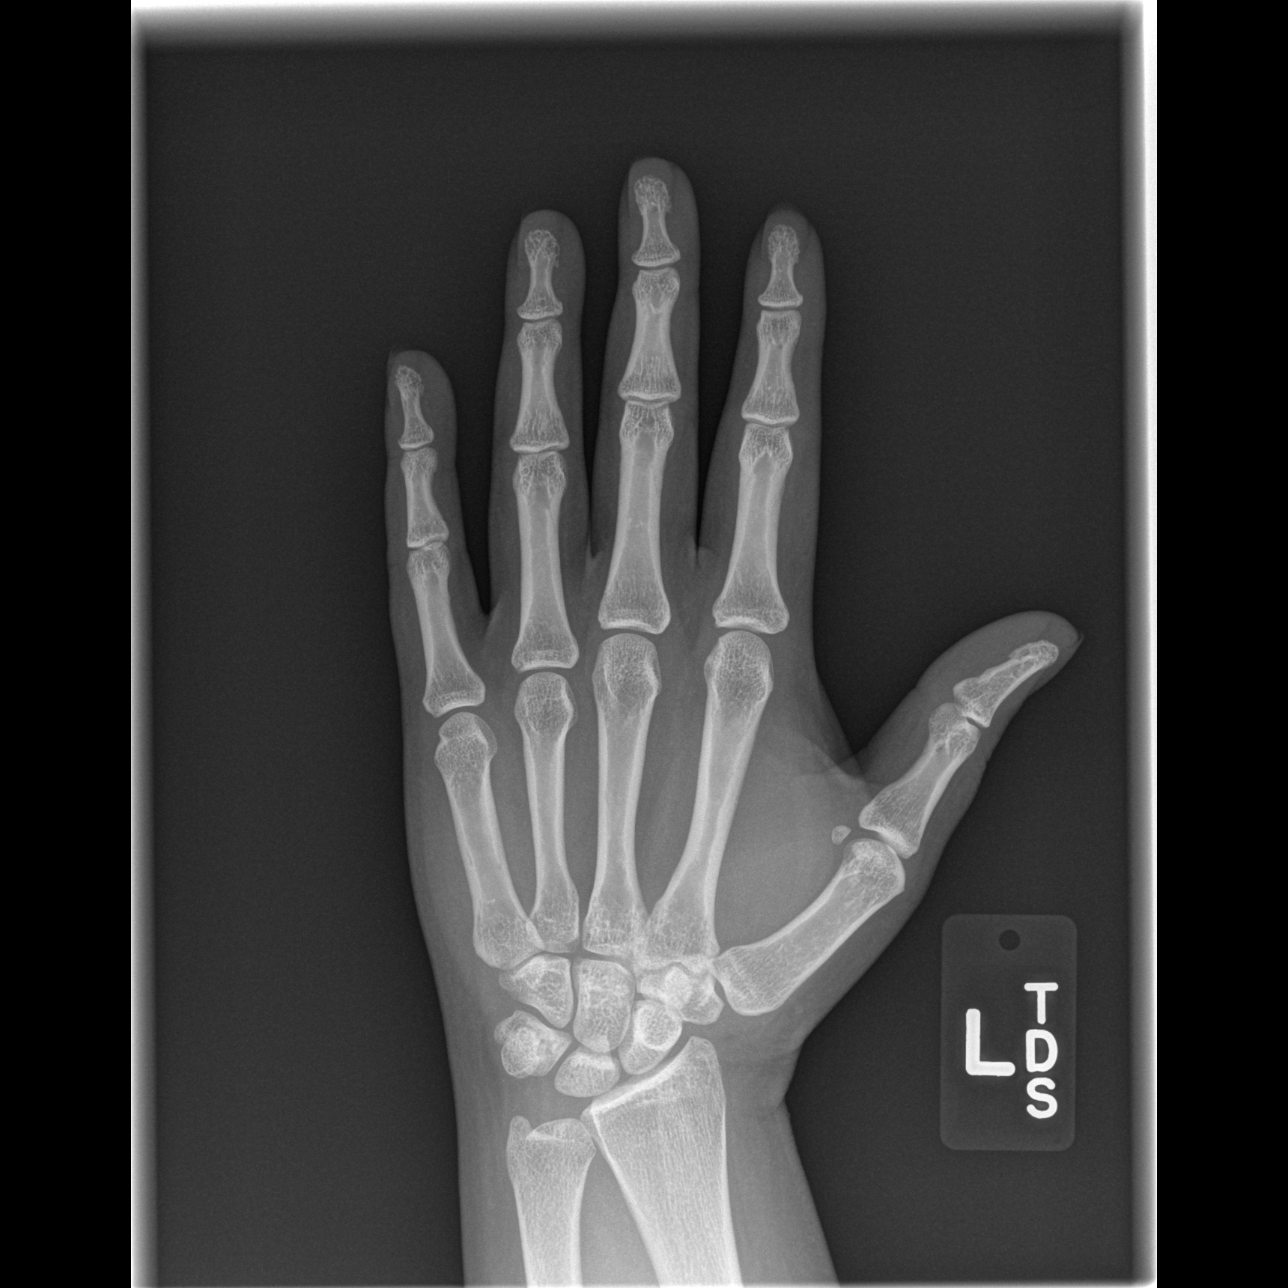

[x hand oblique left]
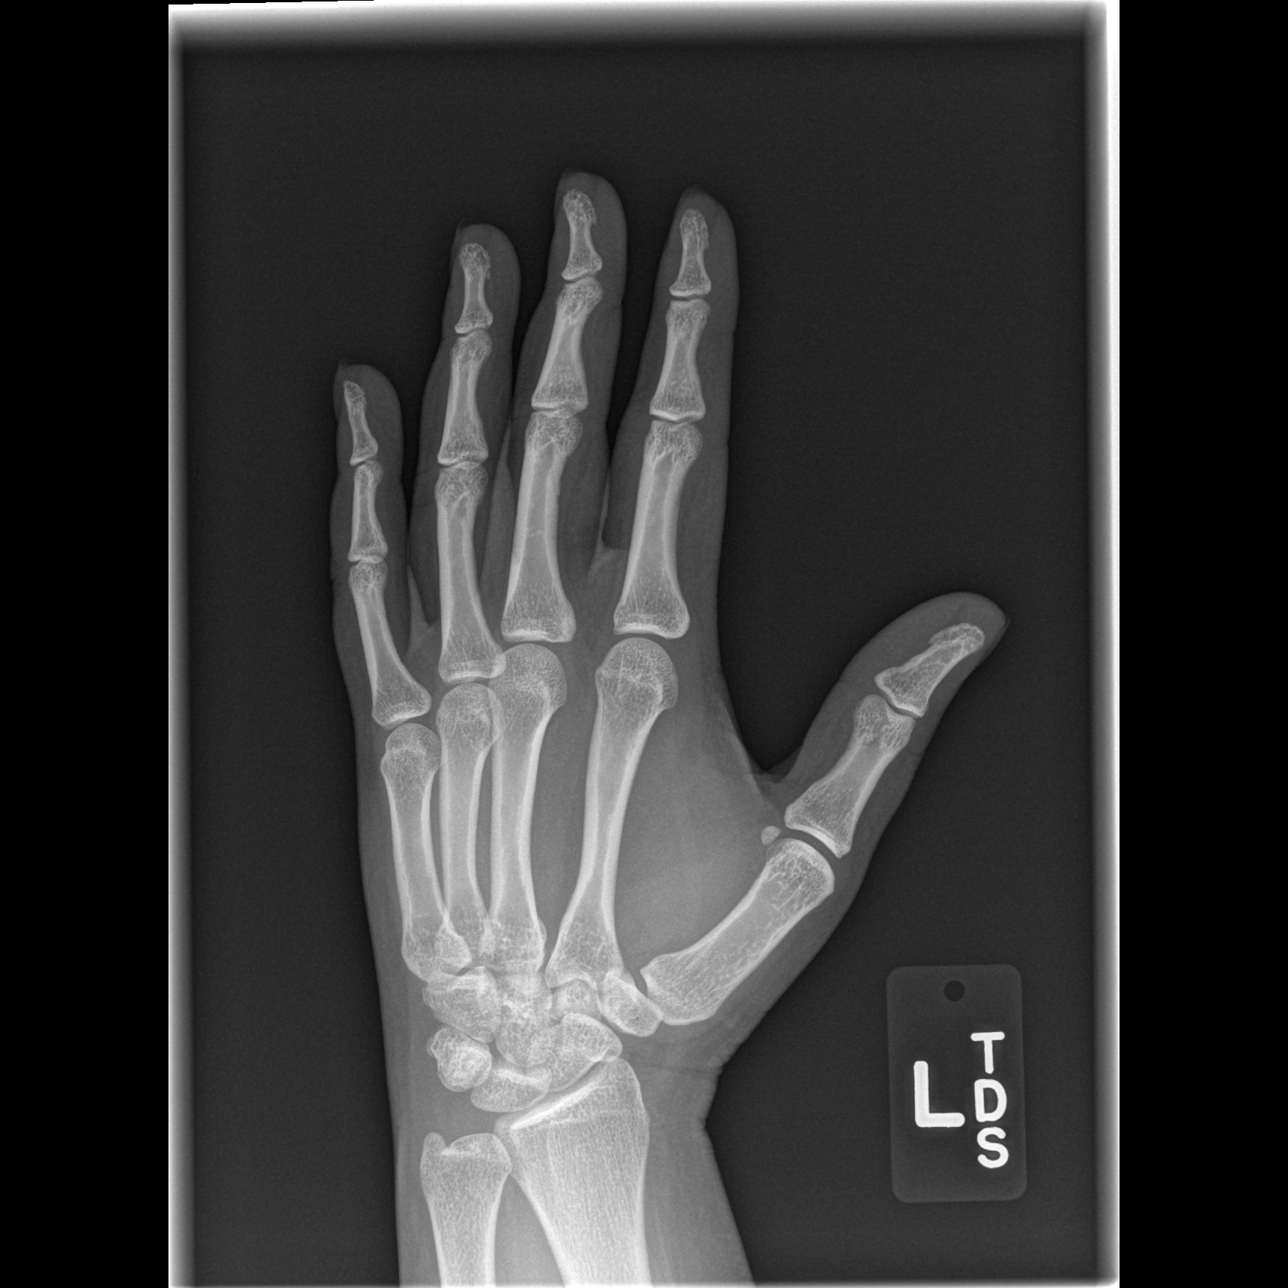

[x hand lat left]
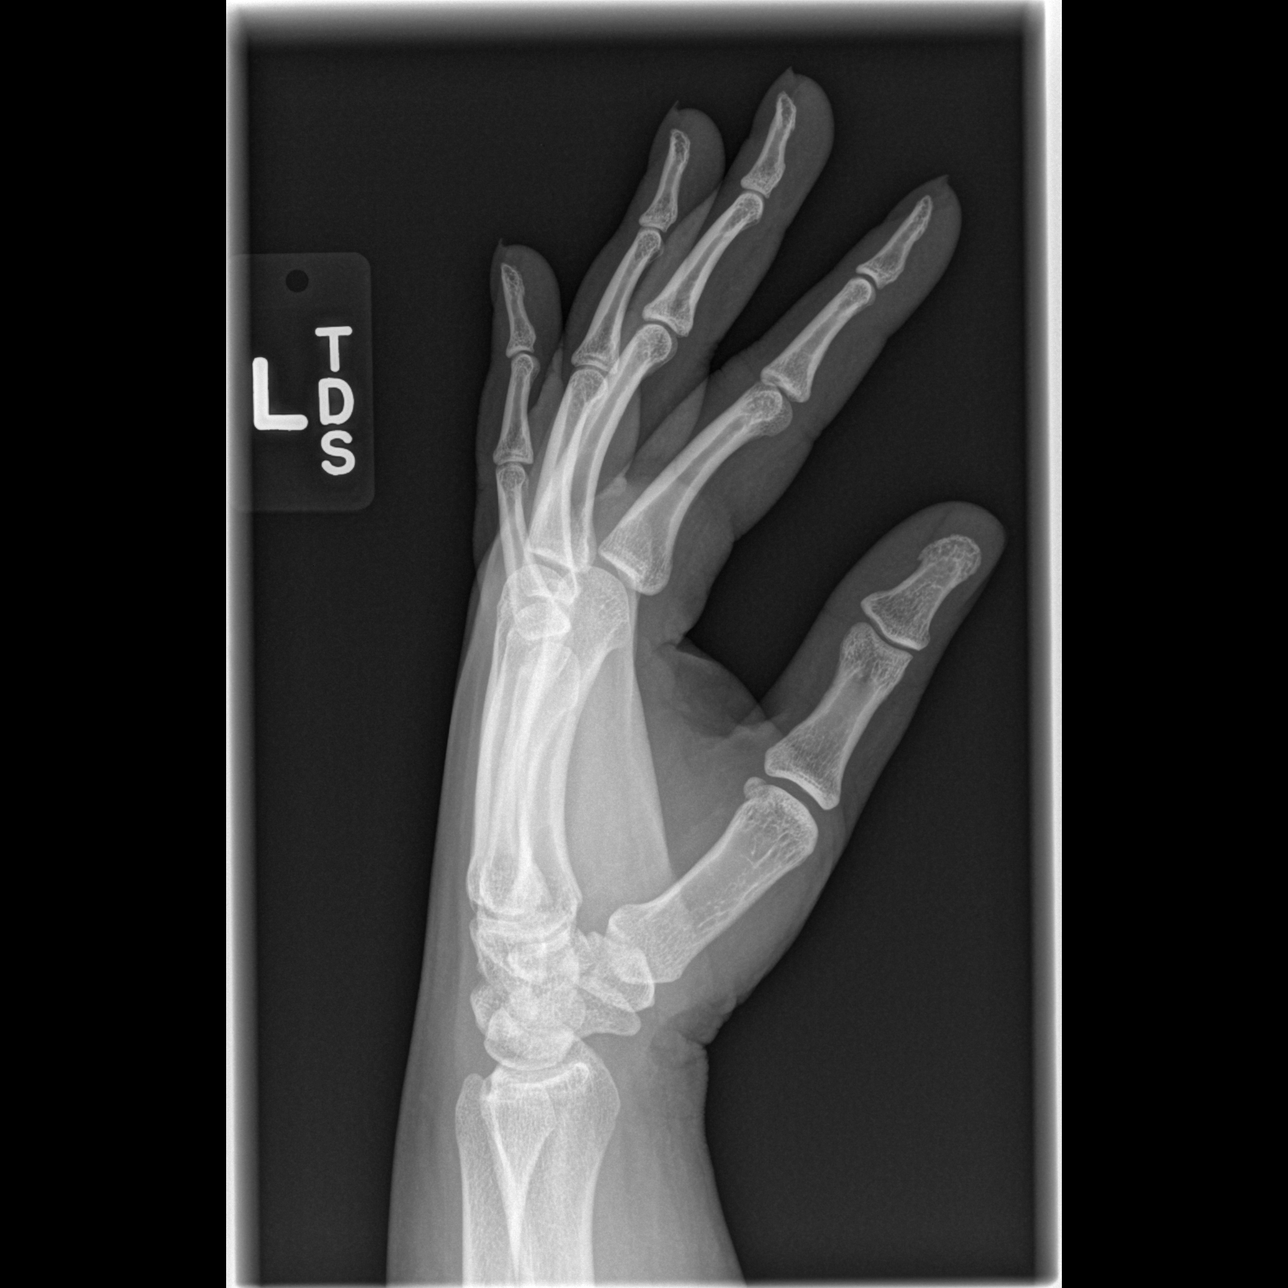

[3 of 3 positions shown; findings below may reference images not displayed]

FINDINGS: Bone mineralization is within normal limits.  Distal
radius and ulna intact.  Carpal bone alignment within normal
limits.  Joint spaces preserved.  No fracture or dislocation.
IMPRESSION: No acute fracture or dislocation identified about the left hand.

## 2015-05-31 ENCOUNTER — Other Ambulatory Visit: Payer: Self-pay | Admitting: Obstetrics and Gynecology

## 2015-06-01 LAB — CYTOLOGY - PAP

## 2018-03-18 ENCOUNTER — Ambulatory Visit (HOSPITAL_COMMUNITY)
Admission: EM | Admit: 2018-03-18 | Discharge: 2018-03-18 | Disposition: A | Discharge status: Home / Self Care | Payer: 59 | Attending: Family Medicine | Admitting: Family Medicine

## 2018-03-18 ENCOUNTER — Other Ambulatory Visit: Payer: Self-pay

## 2018-03-18 ENCOUNTER — Encounter (HOSPITAL_COMMUNITY): Payer: Self-pay | Admitting: Emergency Medicine

## 2018-03-18 DIAGNOSIS — R102 Pelvic and perineal pain: Secondary | ICD-10-CM | POA: Diagnosis not present

## 2018-03-18 DIAGNOSIS — R1030 Lower abdominal pain, unspecified: Secondary | ICD-10-CM | POA: Diagnosis not present

## 2018-03-18 DIAGNOSIS — S76099A Other specified injury of muscle, fascia and tendon of unspecified hip, initial encounter: Secondary | ICD-10-CM | POA: Diagnosis not present

## 2018-03-18 DIAGNOSIS — T148XXA Other injury of unspecified body region, initial encounter: Secondary | ICD-10-CM

## 2018-03-18 DIAGNOSIS — R1032 Left lower quadrant pain: Secondary | ICD-10-CM

## 2018-03-18 MED ORDER — METHOCARBAMOL 750 MG PO TABS: 750.0000 mg | ORAL_TABLET | Freq: Four times a day (QID) | ORAL | 0 refills | Status: AC

## 2018-03-18 MED ORDER — IBUPROFEN 800 MG PO TABS: 800.0000 mg | ORAL_TABLET | Freq: Three times a day (TID) | ORAL | 0 refills | Status: DC

## 2018-03-18 MED ORDER — IBUPROFEN 800 MG PO TABS
ORAL_TABLET | ORAL | Status: AC
  Filled 2018-03-18: qty 1

## 2018-03-18 MED ORDER — IBUPROFEN 800 MG PO TABS
800.0000 mg | ORAL_TABLET | Freq: Once | ORAL | Status: AC
  Administered 2018-03-18: 800 mg via ORAL

## 2018-03-18 NOTE — ED Triage Notes (Signed)
Stood up from a chair and had immediate pain, sharp pain.  Pain initiated at groin area and radiated down left thigh.  Denies back pain.  Onset of pain was today

## 2018-03-18 NOTE — Discharge Instructions (Signed)
Use anti-inflammatories for pain/swelling. You may take up to 800 mg Ibuprofen every 8 hours with food. You may supplement Ibuprofen with Tylenol [phone] mg every 8 hours.   Use robaxin as needed- this is a muscle relaxer  I expect pain to gradually improve over the next 1 to 2 weeks if this is a muscle strain/hip flexor strain.  Please take anti-inflammatories in the meantime.  If you had worsening pain, developing new symptoms, nausea, vomiting, abdominal pain, swelling to the area, urinary symptoms or changes in your bowel movements, or not improving in 2 weeks please return for reevaluation.

## 2018-03-18 NOTE — ED Provider Notes (Signed)
[facility]    CSN: [id number] Arrival date & time: 03/18/18  1604     History   Chief Complaint Chief Complaint  Patient presents with  . Leg Pain    HPI Tracy Johnson is a 32 y.o. female no contributing past medical history presenting today for evaluation of left groin pain.  Patient states that earlier today she stood up from a chair and had an immediate sharp pain in her left groin.  Occasionally will radiate into the thigh.  Denies back pain.  Has previously had sciatica and states that this is felt different.  She has tried salon pause rub without relief.  Denies numbness or tingling.  Denies change in bowel or bladder habits.  Denies saddle anesthesia.  Denies urinary symptoms of dysuria or increased frequency.  Pain aggravated with movement.  Patient was able to walk in to clinic.  HPI  History reviewed. No pertinent past medical history.  There are no active problems to display for this patient.   History reviewed. No pertinent surgical history.  OB History   None      Home Medications    Prior to Admission medications   Medication Sig Start Date End Date Taking? Authorizing Provider  ibuprofen (ADVIL,MOTRIN) 800 MG tablet Take 1 tablet (800 mg total) by mouth 3 (three) times daily. 03/18/18   Rasheema Truluck C, PA-C  methocarbamol (ROBAXIN-750) 750 MG tablet Take 1 tablet (750 mg total) by mouth 4 (four) times daily for 10 days. 03/18/18 03/28/18  Margaux Engen, Junius CreamerHallie C, PA-C    Family History Family History  Problem Relation Age of Onset  . Bronchitis Mother   . Hypertension Mother     Social History Social History   Tobacco Use  . Smoking status: Passive Smoke Exposure - Never Smoker  Substance Use Topics  . Alcohol use: Yes  . Drug use: No     Allergies   Patient has no known allergies.   Review of Systems Review of Systems  Constitutional: Negative for activity change, appetite change and fever.  Eyes: Negative for visual disturbance.    Respiratory: Negative for shortness of breath.   Cardiovascular: Negative for chest pain.  Gastrointestinal: Negative for abdominal pain, nausea and vomiting.  Genitourinary: Negative for decreased urine volume, dysuria and frequency.  Musculoskeletal: Positive for arthralgias, gait problem and myalgias. Negative for back pain, neck pain and neck stiffness.  Skin: Negative for color change and wound.  Neurological: Negative for dizziness, weakness, light-headedness, numbness and headaches.     Physical Exam Triage Vital Signs ED Triage Vitals  Enc Vitals Group     BP 03/18/18 1619 131/82     Pulse Rate 03/18/18 1619 96     Resp 03/18/18 1619 20     Temp 03/18/18 1619 98.9 F (37.2 C)     Temp Source 03/18/18 1619 Oral     SpO2 03/18/18 1619 100 %     Weight --      Height --      Head Circumference --      Peak Flow --      Pain Score 03/18/18 1616 3     Pain Loc --      Pain Edu? --      Excl. in GC? --    No data found.  Updated Vital Signs BP 131/82 (BP Location: Right Arm)   Pulse 96   Temp 98.9 F (37.2 C) (Oral)   Resp 20   LMP 03/18/2018  SpO2 100%   Visual Acuity Right Eye Distance:   Left Eye Distance:   Bilateral Distance:    Right Eye Near:   Left Eye Near:    Bilateral Near:     Physical Exam  Constitutional: She appears well-developed and well-nourished. No distress.  HENT:  Head: Normocephalic and atraumatic.  Eyes: Conjunctivae are normal.  Neck: Neck supple.  Cardiovascular: Normal rate and regular rhythm.  No murmur heard. Pulmonary/Chest: Effort normal and breath sounds normal. No respiratory distress.  Abdominal: Soft. There is no tenderness.  Abdomen soft, nondistended, nontender light deep palpation throughout all 4 quadrants  Musculoskeletal: She exhibits tenderness. She exhibits no edema.  Tenderness to palpation of left groin, does not extend down towards pubic area, nontender to palpation over proximal quadricep.  No overlying  erythema, no induration or fluctuance.  No bulging palpated with Valsalva.  Patient is able to flex that hip but has significant pain with flexion.  Patient able to adduct and abduct at left hip.  Back: Nontender to palpation of cervical, thoracic and lumbar spine, nontender to palpation of right and left lateral lumbar musculature as well as gluteal area.  Negative straight leg raise bilaterally  Neurological: She is alert.  Skin: Skin is warm and dry.  Psychiatric: She has a normal mood and affect.  Nursing note and vitals reviewed.    UC Treatments / Results  Labs (all labs ordered are listed, but only abnormal results are displayed) Labs Reviewed - No data to display  EKG None  Radiology No results found.  Procedures Procedures (including critical care time)  Medications Ordered in UC Medications  ibuprofen (ADVIL,MOTRIN) tablet 800 mg (800 mg Oral Given 03/18/18 1638)    Initial Impression / Assessment and Plan / UC Course  I have reviewed the triage vital signs and the nursing notes.  Pertinent labs & imaging results that were available during my care of the patient were reviewed by me and considered in my medical decision making (see chart for details).     Patient likely with pulled muscle versus hip flexor strain.  No bony abnormality less likely given mechanism of injury.  Patient is ambulatory although with a limp.  At this time will recommend anti-inflammatories.  Offered injection of Toradol in clinic today, patient declined.  Also offered a course of prednisone, patient declined.  Will do anti-inflammatories, ice, and will try Robaxin.Discussed strict return precautions. Patient verbalized understanding and is agreeable with plan.  Final Clinical Impressions(s) / UC Diagnoses   Final diagnoses:  Muscle strain  Groin pain, left     Discharge Instructions     Use anti-inflammatories for pain/swelling. You may take up to 800 mg Ibuprofen every 8 hours with  food. You may supplement Ibuprofen with Tylenol [phone] mg every 8 hours.   Use robaxin as needed- this is a muscle relaxer  I expect pain to gradually improve over the next 1 to 2 weeks if this is a muscle strain/hip flexor strain.  Please take anti-inflammatories in the meantime.  If you had worsening pain, developing new symptoms, nausea, vomiting, abdominal pain, swelling to the area, urinary symptoms or changes in your bowel movements, or not improving in 2 weeks please return for reevaluation.   ED Prescriptions    Medication Sig Dispense Auth. Provider   ibuprofen (ADVIL,MOTRIN) 800 MG tablet Take 1 tablet (800 mg total) by mouth 3 (three) times daily. 21 tablet Cierra Rothgeb C, PA-C   methocarbamol (ROBAXIN-750) 750 MG tablet Take  1 tablet (750 mg total) by mouth 4 (four) times daily for 10 days. 40 tablet Erubiel Manasco, Harvey C, PA-C     Controlled Substance Prescriptions [place] Controlled Substance Registry consulted? Not Applicable   [place], [place], [place] 03/18/18 1647

## 2018-07-01 LAB — BASIC METABOLIC PANEL
BUN: 11 (ref 4–21)
Creatinine: 0.8 (ref 0.5–1.1)
GLUCOSE: 79
Potassium: 3.7 (ref 3.4–5.3)
SODIUM: 141 (ref 137–147)

## 2018-07-01 LAB — TSH: TSH: 2.1 (ref 0.41–5.90)

## 2018-07-01 LAB — CBC AND DIFFERENTIAL
HEMATOCRIT: 36 (ref 36–46)
Hemoglobin: 11 — AB (ref 12.0–16.0)
Platelets: 407 — AB (ref 150–399)
WBC: 8

## 2018-07-01 LAB — LIPID PANEL
Cholesterol: 173 (ref 0–200)
HDL: 26 — AB (ref 35–70)
LDL CALC: 144
Triglycerides: 164 — AB (ref 40–160)

## 2018-07-01 LAB — HEPATIC FUNCTION PANEL
ALK PHOS: 133 — AB (ref 25–125)
ALT: 35 (ref 7–35)
AST: 28 (ref 13–35)

## 2018-07-01 LAB — HM PAP SMEAR

## 2018-07-08 ENCOUNTER — Ambulatory Visit (HOSPITAL_COMMUNITY)
Admission: EM | Admit: 2018-07-08 | Discharge: 2018-07-08 | Disposition: A | Discharge status: Home / Self Care | Payer: 59 | Attending: Family Medicine | Admitting: Family Medicine

## 2018-07-08 ENCOUNTER — Other Ambulatory Visit: Payer: Self-pay

## 2018-07-08 DIAGNOSIS — J00 Acute nasopharyngitis [common cold]: Secondary | ICD-10-CM | POA: Diagnosis not present

## 2018-07-08 DIAGNOSIS — Z7722 Contact with and (suspected) exposure to environmental tobacco smoke (acute) (chronic): Secondary | ICD-10-CM | POA: Insufficient documentation

## 2018-07-08 DIAGNOSIS — J029 Acute pharyngitis, unspecified: Secondary | ICD-10-CM | POA: Diagnosis present

## 2018-07-08 DIAGNOSIS — Z8249 Family history of ischemic heart disease and other diseases of the circulatory system: Secondary | ICD-10-CM | POA: Diagnosis not present

## 2018-07-08 LAB — POCT RAPID STREP A: STREPTOCOCCUS, GROUP A SCREEN (DIRECT): NEGATIVE

## 2018-07-08 NOTE — ED Triage Notes (Signed)
Pt states she has a sore throat , cough and body aches x 3 days.

## 2018-07-08 NOTE — ED Provider Notes (Signed)
[facility]    CSN: [id number] Arrival date & time: 07/08/18  0818     History   Chief Complaint Chief Complaint  Patient presents with  . Sore Throat    HPI Tracy Johnson is a 32 y.o. female.   Tracy Johnson presents with complaints of sore throat and congestion which started two nights ago, worsened yesterday. Chills, body aches, fatigue. No ear pain or cough. Did have a headache but this has improved. No rash. No gi/gu complaints. Took alkaseltzer cold which did help some. Without contributing medical history.     ROS per HPI.      No past medical history on file.  There are no active problems to display for this patient.   No past surgical history on file.  OB History   None      Home Medications    Prior to Admission medications   Medication Sig Start Date End Date Taking? Authorizing Provider  norethindrone-ethinyl estradiol (JUNEL FE,GILDESS FE,LOESTRIN FE) 1-20 MG-MCG tablet Take 1 tablet by mouth daily.   Yes [provider]  ibuprofen (ADVIL,MOTRIN) 800 MG tablet Take 1 tablet (800 mg total) by mouth 3 (three) times daily. 03/18/18   Wieters, Junius Creamer, PA-C    Family History Family History  Problem Relation Age of Onset  . Bronchitis Mother   . Hypertension Mother     Social History Social History   Tobacco Use  . Smoking status: Passive Smoke Exposure - Never Smoker  Substance Use Topics  . Alcohol use: Yes  . Drug use: No     Allergies   Patient has no known allergies.   Review of Systems Review of Systems   Physical Exam Triage Vital Signs ED Triage Vitals  Enc Vitals Group     BP 07/08/18 0900 129/82     Pulse Rate 07/08/18 0900 90     Resp 07/08/18 0900 18     Temp 07/08/18 0900 98.7 F (37.1 C)     Temp Source 07/08/18 0900 Oral     SpO2 07/08/18 0900 99 %     Weight 07/08/18 0859 201 lb (91.2 kg)     Height --      Head Circumference --      Peak Flow --      Pain Score --      Pain Loc --      Pain  Edu? --      Excl. in GC? --    No data found.  Updated Vital Signs BP 129/82 (BP Location: Right Arm)   Pulse 90   Temp 98.7 F (37.1 C) (Oral)   Resp 18   Wt 201 lb (91.2 kg)   LMP 06/30/2018   SpO2 99%   BMI 39.26 kg/m    Physical Exam  Constitutional: She is oriented to person, place, and time. She appears well-developed and well-nourished. No distress.  HENT:  Head: Normocephalic and atraumatic.  Right Ear: Tympanic membrane, external ear and ear canal normal.  Left Ear: Tympanic membrane, external ear and ear canal normal.  Nose: Rhinorrhea present. Right sinus exhibits no maxillary sinus tenderness and no frontal sinus tenderness. Left sinus exhibits no maxillary sinus tenderness and no frontal sinus tenderness.  Mouth/Throat: Uvula is midline, oropharynx is clear and moist and mucous membranes are normal. No tonsillar exudate.  Eyes: Pupils are equal, round, and reactive to light. Conjunctivae and EOM are normal.  Cardiovascular: Normal rate, regular rhythm and normal heart sounds.  Pulmonary/Chest: Effort normal and breath sounds normal.  Neurological: She is alert and oriented to person, place, and time.  Skin: Skin is warm and dry.     UC Treatments / Results  Labs (all labs ordered are listed, but only abnormal results are displayed) Labs Reviewed  CULTURE, GROUP A STREP [facility])  POCT RAPID STREP A    EKG None  Radiology No results found.  Procedures Procedures (including critical care time)  Medications Ordered in UC Medications - No data to display  Initial Impression / Assessment and Plan / UC Course  I have reviewed the triage vital signs and the nursing notes.  Pertinent labs & imaging results that were available during my care of the patient were reviewed by me and considered in my medical decision making (see chart for details).     Benign physical exam. Non toxic. Afebrile. History and physical consistent with viral illness.  Supportive  cares recommended. If symptoms worsen or do not improve in the next week to return to be seen or to follow up with PCP.  Patient verbalized understanding and agreeable to plan.   Final Clinical Impressions(s) / UC Diagnoses   Final diagnoses:  Acute nasopharyngitis     Discharge Instructions     Continue with over the counter treatments as needed for symptoms.  Tylenol and/or ibuprofen as needed for pain or fevers.   Throat lozenges, gargles, chloraseptic spray, warm teas, popsicles etc to help with throat pain.   Rest.  If symptoms worsen or do not improve in the next week to return to be seen or to follow up with your PCP.     ED Prescriptions    None     Controlled Substance Prescriptions [place] Controlled Substance Registry consulted? Not Applicable   Georgetta Haber, NP 07/08/18 1004

## 2018-07-08 NOTE — Discharge Instructions (Signed)
Continue with over the counter treatments as needed for symptoms.  Tylenol and/or ibuprofen as needed for pain or fevers.   Throat lozenges, gargles, chloraseptic spray, warm teas, popsicles etc to help with throat pain.   Rest.  If symptoms worsen or do not improve in the next week to return to be seen or to follow up with your PCP.

## 2018-07-09 LAB — HM PAP SMEAR: HM Pap smear: NEGATIVE

## 2018-07-10 LAB — CULTURE, GROUP A STREP (THRC)

## 2018-07-12 ENCOUNTER — Ambulatory Visit (HOSPITAL_COMMUNITY)
Admission: EM | Admit: 2018-07-12 | Discharge: 2018-07-12 | Disposition: A | Discharge status: Home / Self Care | Payer: 59 | Attending: Emergency Medicine | Admitting: Emergency Medicine

## 2018-07-12 ENCOUNTER — Encounter (HOSPITAL_COMMUNITY): Payer: Self-pay | Admitting: Emergency Medicine

## 2018-07-12 DIAGNOSIS — J039 Acute tonsillitis, unspecified: Secondary | ICD-10-CM

## 2018-07-12 DIAGNOSIS — J029 Acute pharyngitis, unspecified: Secondary | ICD-10-CM

## 2018-07-12 LAB — POCT INFECTIOUS MONO SCREEN: MONO SCREEN: NEGATIVE

## 2018-07-12 MED ORDER — AMOXICILLIN 500 MG PO CAPS: 500.0000 mg | ORAL_CAPSULE | Freq: Two times a day (BID) | ORAL | 0 refills | Status: AC

## 2018-07-12 NOTE — Discharge Instructions (Signed)
Negative strep at last visit and negative culture.  Mono is negative today as well.  This still may be viral in nature but due to worsening and persistence we will treat with antibiotics at this time.  Complete course of antibiotics.  Continue with over the counter treatments as needed.  If symptoms worsen or do not improve in the next week to return to be seen or to follow up with your PCP.

## 2018-07-12 NOTE — ED Provider Notes (Signed)
[facility]    CSN: [id number] Arrival date & time: 07/12/18  1436     History   Chief Complaint Chief Complaint  Patient presents with  . URI    HPI Tracy Johnson is a 32 y.o. female.   Tracy Johnson presents with complaints of persistent sore throat, pain with swallowing and hoarseness. Only occasional cough. No specific known fevers. No headache. No rash. No ear pain but feels pressure to ears. No gi/gu complains. Has been trying multiple over the counter treatments which have not helped. Was seen here 9/26, negative strep and culture at that time. No known ill contacts.    ROS per HPI.      History reviewed. No pertinent past medical history.  There are no active problems to display for this patient.   History reviewed. No pertinent surgical history.  OB History   None      Home Medications    Prior to Admission medications   Medication Sig Start Date End Date Taking? Authorizing Provider  amoxicillin (AMOXIL) 500 MG capsule Take 1 capsule (500 mg total) by mouth 2 (two) times daily for 10 days. 07/12/18 07/22/18  Georgetta Haber, NP  ibuprofen (ADVIL,MOTRIN) 800 MG tablet Take 1 tablet (800 mg total) by mouth 3 (three) times daily. 03/18/18   Wieters, Hallie C, PA-C  norethindrone-ethinyl estradiol (JUNEL FE,GILDESS FE,LOESTRIN FE) 1-20 MG-MCG tablet Take 1 tablet by mouth daily.    [provider]    Family History Family History  Problem Relation Age of Onset  . Bronchitis Mother   . Hypertension Mother     Social History Social History   Tobacco Use  . Smoking status: Passive Smoke Exposure - Never Smoker  Substance Use Topics  . Alcohol use: Yes  . Drug use: No     Allergies   Patient has no known allergies.   Review of Systems Review of Systems   Physical Exam Triage Vital Signs ED Triage Vitals [07/12/18 1529]  Enc Vitals Group     BP (!) 141/94     Pulse Rate 100     Resp 16     Temp 99.4 F (37.4 C)     Temp  src      SpO2 100 %     Weight      Height      Head Circumference      Peak Flow      Pain Score      Pain Loc      Pain Edu?      Excl. in GC?    No data found.  Updated Vital Signs BP (!) 141/94   Pulse 100   Temp 99.4 F (37.4 C)   Resp 16   LMP 06/30/2018   SpO2 100%    Physical Exam  Constitutional: She is oriented to person, place, and time. She appears well-developed and well-nourished. No distress.  HENT:  Head: Normocephalic and atraumatic.  Right Ear: Tympanic membrane, external ear and ear canal normal.  Left Ear: Tympanic membrane, external ear and ear canal normal.  Nose: Nose normal.  Mouth/Throat: Uvula is midline and mucous membranes are normal. Posterior oropharyngeal erythema present. Tonsils are 2+ on the right. Tonsils are 2+ on the left. Tonsillar exudate.  Eyes: Pupils are equal, round, and reactive to light. Conjunctivae and EOM are normal.  Cardiovascular: Normal rate, regular rhythm and normal heart sounds.  Pulmonary/Chest: Effort normal and breath sounds normal.  Lymphadenopathy:  She has cervical adenopathy.  Neurological: She is alert and oriented to person, place, and time.  Skin: Skin is warm and dry.     UC Treatments / Results  Labs (all labs ordered are listed, but only abnormal results are displayed) Labs Reviewed  POCT INFECTIOUS MONO SCREEN    EKG None  Radiology No results found.  Procedures Procedures (including critical care time)  Medications Ordered in UC Medications - No data to display  Initial Impression / Assessment and Plan / UC Course  I have reviewed the triage vital signs and the nursing notes.  Pertinent labs & imaging results that were available during my care of the patient were reviewed by me and considered in my medical decision making (see chart for details).     Negative rapid strep and culture, negative mono screen here today. Impressive tonsillar exam with persistent symptoms, temp 99.5.  Opted to provide coverage with amoxicillin at this time. If symptoms worsen or do not improve in the next week to return to be seen or to follow up with PCP.  Patient verbalized understanding and agreeable to plan.    Final Clinical Impressions(s) / UC Diagnoses   Final diagnoses:  Acute tonsillitis, unspecified etiology   Discharge Instructions   None    ED Prescriptions    Medication Sig Dispense Auth. Provider   amoxicillin (AMOXIL) 500 MG capsule Take 1 capsule (500 mg total) by mouth 2 (two) times daily for 10 days. 20 capsule Georgetta Haber, NP     Controlled Substance Prescriptions [place] Controlled Substance Registry consulted? Not Applicable   Georgetta Haber, NP 07/12/18 1640

## 2018-07-12 NOTE — ED Triage Notes (Signed)
Pt states she was seen here four days ago dx with URI, pt states she still feels bad and "I dont think its just an URI". Pt swabbed and cultured for strep, negative.

## 2018-07-26 ENCOUNTER — Ambulatory Visit (INDEPENDENT_AMBULATORY_CARE_PROVIDER_SITE_OTHER): Payer: 59 | Admitting: Family Medicine

## 2018-07-26 ENCOUNTER — Encounter: Payer: Self-pay | Admitting: Family Medicine

## 2018-07-26 DIAGNOSIS — D509 Iron deficiency anemia, unspecified: Secondary | ICD-10-CM

## 2018-07-26 DIAGNOSIS — E7800 Pure hypercholesterolemia, unspecified: Secondary | ICD-10-CM | POA: Diagnosis not present

## 2018-07-26 DIAGNOSIS — G43009 Migraine without aura, not intractable, without status migrainosus: Secondary | ICD-10-CM | POA: Diagnosis not present

## 2018-07-26 DIAGNOSIS — G43909 Migraine, unspecified, not intractable, without status migrainosus: Secondary | ICD-10-CM | POA: Insufficient documentation

## 2018-07-26 NOTE — Progress Notes (Signed)
Tracy Johnson DOB: [birth year] Encounter date: 07/26/2018  This isa 32 y.o. female who presents to establish care. Chief Complaint  Patient presents with  . New Patient (Initial Visit)    was seen in ED told it was upper resp. went back and was told she has tonsilitis, feeling better but states that her throat is still red, no pain    History of present illness: Left previous provider due to personal and financial conflict.   Just found out about high cholesterol from obgyn labwork. Uncertain numbers.   Migraines more when younger; sun exposure related. Not getting these much now.   Seen 9/30 in ER with enlarged tonsils, low grade fever tx with amox. Negative strep/culture and neg mono.   Feels fine now after completion of antibiotic. Initially went to urgent care for sore throat sx; dx with URI/viral. Waited a couple of days then around 7 days went back due to throat swelling. Swelling did go down on antibiotic.Did salt water gargling; throat just stayed scratchy. Did more gagging; just felt dry in back of throat. Still says back of throat looks bad; but isn't painful. Now has a little hack. No fevers in last few days. Not coughing anything up.     Past Medical History:  Diagnosis Date  . High cholesterol   . Migraine    Past Surgical History:  Procedure Laterality Date  . none     No Known Allergies Current Meds  Medication Sig  . ibuprofen (ADVIL,MOTRIN) 800 MG tablet Take 1 tablet (800 mg total) by mouth 3 (three) times daily.  . Multiple Vitamins-Minerals (MULTIVITAMIN GUMMIES WOMENS PO) Take by mouth.  . norethindrone-ethinyl estradiol (JUNEL FE,GILDESS FE,LOESTRIN FE) 1-20 MG-MCG tablet Take 1 tablet by mouth daily.   Social History   Tobacco Use  . Smoking status: Passive Smoke Exposure - Never Smoker  . Smokeless tobacco: Never Used  Substance Use Topics  . Alcohol use: Yes    Comment: occasional   Family History  Problem Relation Age of Onset  . Hypertension  Mother   . Arthritis Mother   . COPD Mother   . Diabetes Mother   . High Cholesterol Mother   . Arthritis Maternal Grandmother   . Alzheimer's disease Maternal Grandmother      Review of Systems  Constitutional: Negative for chills, fatigue and fever.  HENT: Negative for congestion, ear pain, postnasal drip, sinus pressure, sinus pain, sore throat and trouble swallowing.   Respiratory: Negative for cough, chest tightness, shortness of breath and wheezing.   Cardiovascular: Negative for chest pain, palpitations and leg swelling.  Skin:       Lumps in armpit around cycle; go away out of cycle. Discussed with obgyn. Not painful and goes away between cycle.   Psychiatric/Behavioral: The patient is not nervous/anxious.     Objective:  BP 120/70 (BP Location: Left Arm, Patient Position: Sitting, Cuff Size: Normal)   Pulse 95   Temp 98.3 F (36.8 C) (Oral)   Ht 5' (1.524 m)   Wt 198 lb 12.8 oz (90.2 kg)   LMP 06/30/2018   SpO2 95%   BMI 38.83 kg/m   Weight: 198 lb 12.8 oz (90.2 kg)   BP Readings from Last 3 Encounters:  07/26/18 120/70  07/12/18 (!) 141/94  07/08/18 129/82   Wt Readings from Last 3 Encounters:  07/26/18 198 lb 12.8 oz (90.2 kg)  07/08/18 201 lb (91.2 kg)  09/30/11 165 lb (74.8 kg)    Physical Exam  Constitutional: She is oriented to person, place, and time. She appears well-developed and well-nourished. No distress.  Cardiovascular: Normal rate, regular rhythm and normal heart sounds. Exam reveals no friction rub.  No murmur heard. No lower extremity edema  Pulmonary/Chest: Effort normal and breath sounds normal. No respiratory distress. She has no wheezes. She has no rales.  Neurological: She is alert and oriented to person, place, and time.  Psychiatric: Her behavior is normal. Thought content normal. Cognition and memory are normal.    Assessment/Plan:  1. Iron deficiency anemia, unspecified iron deficiency anemia type I will review bloodwork once  sent. Record release signed today.   2. High cholesterol See above.  3. Migraine without aura and without status migrainosus, not intractable Well controlled. Will let me know if any worsening of migraines.    Return pending bloodwork review.  Theodis Shove, MD   Declines flu shot today.

## 2018-07-26 NOTE — Patient Instructions (Signed)
I will call you when I see obgyn lab results but if you haven't heard from me in 1 months time; please check in to make sure I got your records.

## 2018-08-02 ENCOUNTER — Encounter: Payer: Self-pay | Admitting: Family Medicine

## 2018-08-04 ENCOUNTER — Telehealth: Payer: Self-pay | Admitting: *Deleted

## 2018-08-04 ENCOUNTER — Encounter: Payer: Self-pay | Admitting: Family Medicine

## 2018-08-04 NOTE — Telephone Encounter (Signed)
Per Dr Hassan Rowan, I called the pt and she stated the OB/GYN did not inform her about the results of elevated sugar levels and her vitamin D level.  Appt scheduled for 10/30 and results were placed on Dr Dorita Fray desk.

## 2018-08-11 ENCOUNTER — Ambulatory Visit (INDEPENDENT_AMBULATORY_CARE_PROVIDER_SITE_OTHER): Payer: 59 | Admitting: Family Medicine

## 2018-08-11 ENCOUNTER — Encounter: Payer: Self-pay | Admitting: Family Medicine

## 2018-08-11 VITALS — BP 130/80 | HR 92 | Temp 97.9°F | Wt 199.4 lb

## 2018-08-11 DIAGNOSIS — L732 Hidradenitis suppurativa: Secondary | ICD-10-CM

## 2018-08-11 DIAGNOSIS — R7303 Prediabetes: Secondary | ICD-10-CM | POA: Diagnosis not present

## 2018-08-11 DIAGNOSIS — E785 Hyperlipidemia, unspecified: Secondary | ICD-10-CM | POA: Diagnosis not present

## 2018-08-11 DIAGNOSIS — E669 Obesity, unspecified: Secondary | ICD-10-CM

## 2018-08-11 MED ORDER — CLINDAMYCIN PHOSPHATE 1 % EX SOLN: Freq: Two times a day (BID) | CUTANEOUS | 5 refills | Status: DC

## 2018-08-11 MED ORDER — VITAMIN D (ERGOCALCIFEROL) 1.25 MG (50000 UNIT) PO CAPS: 50000.0000 [IU] | ORAL_CAPSULE | ORAL | 0 refills | Status: DC

## 2018-08-11 NOTE — Progress Notes (Signed)
Tracy Johnson DOB: [birth year] Encounter date: 08/11/2018  This is a 32 y.o. female who presents with Chief Complaint  Patient presents with  . Follow-up    History of present illness: Tracy Johnson is here today to discuss lab work that was completed by her gynecologist.  We reviewed lab work in detail including her vitamin D deficiency as well as A1c in the prediabetic range.  She had not reviewed these labs in detail with her provider.  Took today's time to discuss ways that she can improve on maintaining a healthier lifestyle and adding in regular exercise as well as dietary improvements.  Does have time in early morning for exercise.   Tracy Johnson lives with her and is also prediabetic. They do not follow specific diet. States mom is stubborn about changes.   She does have gym membership. Hasn't been going regularly but feels comfortable going. Did lose weight when exercising regularly in the past. Also made self eat three times daily which she thinks helped. Now she eats twice a day.   Usually eats meals at work. 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 Snacks through night.     No Known Allergies Current Meds  Medication Sig  . Multiple Vitamins-Minerals (MULTIVITAMIN GUMMIES WOMENS PO) Take by mouth.  . norethindrone-ethinyl estradiol (JUNEL FE,GILDESS FE,LOESTRIN FE) 1-20 MG-MCG tablet Take 1 tablet by mouth daily.    Review of Systems  Constitutional: Negative for chills, fatigue and fever.  Respiratory: Negative for cough, chest tightness, shortness of breath and wheezing.   Cardiovascular: Negative for chest pain, palpitations and leg swelling.  Skin:       Past few years has had recurrent cysts in the armpit.  These come up as tender nodules that eventually do seem to drain.  When they drained, they typically have clear liquid.  Seem to come more when she is at a heavier weight and is getting more friction in the armpit.  Nodules do completely resolve in between episodes.  Has discussed  this with gynecologist in the past.    Objective:  BP 130/80 (BP Location: Left Arm, Patient Position: Sitting, Cuff Size: Large)   Pulse 92 Comment: manual  Temp 97.9 F (36.6 C) (Oral)   Wt 199 lb 6.4 oz (90.4 kg)   BMI 38.94 kg/m   Weight: 199 lb 6.4 oz (90.4 kg)   BP Readings from Last 3 Encounters:  08/11/18 130/80  07/26/18 120/70  07/12/18 (!) 141/94   Wt Readings from Last 3 Encounters:  08/11/18 199 lb 6.4 oz (90.4 kg)  07/26/18 198 lb 12.8 oz (90.2 kg)  07/08/18 201 lb (91.2 kg)    Physical Exam  Constitutional: She is oriented to person, place, and time. She appears well-developed and well-nourished. No distress.  Cardiovascular: Exam reveals no friction rub.  No murmur heard. No lower extremity edema  Pulmonary/Chest: Effort normal.  Neurological: She is alert and oriented to person, place, and time.  Skin:  Small superficial half centimeter nodule just below the skin right axilla that is somewhat tender.  Feels consistent with a slightly inflamed cyst.  No overlying warmth or erythema.  Psychiatric: Her behavior is normal. Cognition and memory are normal.    Assessment/Plan  1. Prediabetes Over 20 minutes of her 30-minute visit today was spent discussing healthy ways to adjust diet and to being a more lower carb diet.  We discussed healthy snacking.  We discussed meal planning and preparation to prevent impulse eating.  We discussed importance of regular exercise for  achieving healthy weight as well as lowering blood sugars.  She would like to do a follow-up after the holidays to make sure she is staying on track with exercise as well as diet.  I did offer referral to a dietitian which she is declined at this time.  However we discussed that I am happy to refer her at any point if she changes her mind.  I do think it would be helpful for her to attend with her Tracy Johnson since they live together and eat meals together.  2. Dyslipidemia The above.  I feel that with  exercise and healthier eating cholesterol will improve.  3. Morbid obesity (HCC) See above.  4. Hidradenitis suppurativa: We discussed trial of clindamycin solution to decrease infections.   Return in about 4 months (around 12/11/2018).     Theodis Shove, MD

## 2018-08-11 NOTE — Telephone Encounter (Signed)
Patient was seen in office today.  

## 2018-08-11 NOTE — Patient Instructions (Signed)
Clindamycin to armpits daily. If irritation occurs; use every other day.  Try natural deodorant (like Native).

## 2018-08-12 ENCOUNTER — Encounter: Payer: Self-pay | Admitting: Family Medicine

## 2018-08-12 DIAGNOSIS — E669 Obesity, unspecified: Secondary | ICD-10-CM | POA: Insufficient documentation

## 2018-08-12 DIAGNOSIS — R7303 Prediabetes: Secondary | ICD-10-CM | POA: Insufficient documentation

## 2018-10-01 ENCOUNTER — Ambulatory Visit (HOSPITAL_COMMUNITY)
Admission: EM | Admit: 2018-10-01 | Discharge: 2018-10-01 | Disposition: A | Discharge status: Home / Self Care | Payer: 59 | Attending: Family Medicine | Admitting: Family Medicine

## 2018-10-01 ENCOUNTER — Encounter (HOSPITAL_COMMUNITY): Payer: Self-pay

## 2018-10-01 DIAGNOSIS — L03031 Cellulitis of right toe: Secondary | ICD-10-CM | POA: Insufficient documentation

## 2018-10-01 MED ORDER — DOXYCYCLINE HYCLATE 100 MG PO TABS: 100.0000 mg | ORAL_TABLET | Freq: Two times a day (BID) | ORAL | 0 refills | Status: DC

## 2018-10-01 NOTE — Discharge Instructions (Addendum)
Soak the toe in Epsom Salts  Return in 3 days if redness and pain continue.

## 2018-10-01 NOTE — ED Triage Notes (Addendum)
Pt presents right foot big toe pain, that started yesterday morning.

## 2018-10-01 NOTE — ED Provider Notes (Addendum)
[facility]    CSN: [id number] Arrival date & time: 10/01/18  1430     History   Chief Complaint Chief Complaint  Patient presents with  . Ingrown Toenail    HPI Tracy Johnson is a 32 y.o. female.   This is a 32 year old woman, established patient here at [facility], who presents right foot big toe pain, that started this morning.  She had a pedicure yesterday.     Past Medical History:  Diagnosis Date  . High cholesterol   . Migraine   . Obesity (BMI 30.0-34.9)   . Prediabetes     Patient Active Problem List   Diagnosis Date Noted  . Prediabetes 08/12/2018  . Obesity (BMI 30.0-34.9) 08/12/2018  . Iron deficiency anemia 07/26/2018  . High cholesterol 07/26/2018  . Migraines 07/26/2018    Past Surgical History:  Procedure Laterality Date  . none      OB History   No obstetric history on file.      Home Medications    Prior to Admission medications   Medication Sig Start Date End Date Taking? Authorizing Provider  clindamycin (CLEOCIN T) 1 % external solution Apply topically 2 (two) times daily. 08/11/18   Wynn BankerKoberlein, Junell C, MD  doxycycline (VIBRA-TABS) 100 MG tablet Take 1 tablet (100 mg total) by mouth 2 (two) times daily. 10/01/18   Elvina SidleLauenstein, Meet [place], MD  Multiple Vitamins-Minerals (MULTIVITAMIN GUMMIES WOMENS PO) Take by mouth.    [provider]  norethindrone-ethinyl estradiol (JUNEL FE,GILDESS FE,LOESTRIN FE) 1-20 MG-MCG tablet Take 1 tablet by mouth daily.    [provider]  Vitamin D, Ergocalciferol, (DRISDOL) 50000 units CAPS capsule Take 1 capsule (50,000 Units total) by mouth every 7 (seven) days. 08/11/18   Wynn BankerKoberlein, Junell C, MD    Family History Family History  Problem Relation Age of Onset  . Hypertension Mother   . Arthritis Mother   . COPD Mother   . Diabetes Mother   . High Cholesterol Mother   . Arthritis Maternal Grandmother   . Alzheimer's disease Maternal Grandmother     Social  History Social History   Tobacco Use  . Smoking status: Passive Smoke Exposure - Never Smoker  . Smokeless tobacco: Never Used  Substance Use Topics  . Alcohol use: Yes    Comment: occasional  . Drug use: No     Allergies   Patient has no known allergies.   Review of Systems Review of Systems   Physical Exam Triage Vital Signs ED Triage Vitals  Enc Vitals Group     BP 10/01/18 1446 130/75     Pulse Rate 10/01/18 1446 (!) 103     Resp 10/01/18 1446 16     Temp 10/01/18 1446 98.3 F (36.8 C)     Temp Source 10/01/18 1446 Oral     SpO2 10/01/18 1446 100 %     Weight --      Height --      Head Circumference --      Peak Flow --      Pain Score 10/01/18 1449 7     Pain Loc --      Pain Edu? --      Excl. in [place]? --    No data found.  Updated Vital Signs BP 130/75 (BP Location: Left Arm)   Pulse (!) 103   Temp 98.3 F (36.8 C) (Oral)   Resp 16   LMP 09/26/2018   SpO2 100%  Physical Exam Vitals signs and nursing note reviewed.  Constitutional:      Appearance: Normal appearance. She is obese.  HENT:     Head: Normocephalic.     Mouth/Throat:     Mouth: Mucous membranes are moist.  Eyes:     Conjunctiva/sclera: Conjunctivae normal.  Pulmonary:     Effort: Pulmonary effort is normal.  Musculoskeletal: Normal range of motion.  Skin:    General: Skin is warm and dry.     Findings: Erythema present.  Neurological:     Mental Status: She is alert.        UC Treatments / Results  Labs (all labs ordered are listed, but only abnormal results are displayed) Labs Reviewed - No data to display  EKG None  Radiology No results found.  Procedures Procedures (including critical care time)  Medications Ordered in UC Medications - No data to display  Initial Impression / Assessment and Plan / UC Course  I have reviewed the triage vital signs and the nursing notes.  Pertinent labs & imaging results that were available during my care of the  patient were reviewed by me and considered in my medical decision making (see chart for details).    Final Clinical Impressions(s) / UC Diagnoses   Final diagnoses:  Cellulitis of toe of right foot     Discharge Instructions     Soak the toe in Epsom Salts  Return in 3 days if redness and pain continue.    ED Prescriptions    Medication Sig Dispense Auth. Provider   doxycycline (VIBRA-TABS) 100 MG tablet Take 1 tablet (100 mg total) by mouth 2 (two) times daily. 20 tablet Elvina SidleLauenstein, Jerryl Holzhauer, MD     Controlled Substance Prescriptions [place] Controlled Substance Registry consulted? Not Applicable   Elvina SidleLauenstein, Samanta Gal, MD 10/01/18 1511    Elvina SidleLauenstein, Kishawn Pickar, MD 10/01/18 [phone]

## 2019-01-11 ENCOUNTER — Telehealth: Payer: Self-pay

## 2019-01-11 NOTE — Telephone Encounter (Signed)
webex appointment has been scheduled for 01/14/2019.

## 2019-01-11 NOTE — Telephone Encounter (Signed)
Last OV was 08/11/18. She is due for her follow up.

## 2019-01-14 ENCOUNTER — Ambulatory Visit: Payer: 59 | Admitting: Family Medicine

## 2019-08-05 ENCOUNTER — Other Ambulatory Visit: Payer: Self-pay

## 2019-08-05 ENCOUNTER — Ambulatory Visit (HOSPITAL_COMMUNITY)
Admission: EM | Admit: 2019-08-05 | Discharge: 2019-08-05 | Disposition: A | Discharge status: Home / Self Care | Payer: 59 | Attending: Urgent Care | Admitting: Urgent Care

## 2019-08-05 ENCOUNTER — Encounter (HOSPITAL_COMMUNITY): Payer: Self-pay | Admitting: Urgent Care

## 2019-08-05 DIAGNOSIS — L509 Urticaria, unspecified: Secondary | ICD-10-CM | POA: Diagnosis not present

## 2019-08-05 DIAGNOSIS — L299 Pruritus, unspecified: Secondary | ICD-10-CM

## 2019-08-05 MED ORDER — HYDROXYZINE HCL 25 MG PO TABS: 12.5000 mg | ORAL_TABLET | Freq: Three times a day (TID) | ORAL | 0 refills | Status: DC | PRN

## 2019-08-05 NOTE — ED Triage Notes (Signed)
Pt presents with intermittent allergic reaction to some detergent her mom bought her.

## 2019-08-05 NOTE — ED Provider Notes (Signed)
MRN: [id number] DOB: 03-Aug-1986  Subjective:   Tracy Johnson is a 33 y.o. female presenting for 2 to 3-day history of persistent intermittent scattered itchy hives over different spots of her body.  Patient thought that this was due to using a detergent that she was not used to.  So she stopped using this and rewash her clothes with no detergent for gentle skin.  The only other inciting factor that she could make of his eating salmon which she normally does not do.  Outside of that she does not have any new exposures including hygiene products, doing yard work, new medications.  She denies having any other symptoms.  No current facility-administered medications for this encounter.   Current Outpatient Medications:  Marland Kitchen  Multiple Vitamins-Minerals (MULTIVITAMIN GUMMIES WOMENS PO), Take by mouth., Disp: , Rfl:  .  norethindrone-ethinyl estradiol (JUNEL FE,GILDESS FE,LOESTRIN FE) 1-20 MG-MCG tablet, Take 1 tablet by mouth daily., Disp: , Rfl:  .  Vitamin D, Ergocalciferol, (DRISDOL) 50000 units CAPS capsule, Take 1 capsule (50,000 Units total) by mouth every 7 (seven) days., Disp: 12 capsule, Rfl: 0    No Known Allergies   Past Medical History:  Diagnosis Date  . High cholesterol   . Migraine   . Obesity (BMI 30.0-34.9)   . Prediabetes      Past Surgical History:  Procedure Laterality Date  . none      Review of Systems  Constitutional: Negative for fever and malaise/fatigue.  HENT: Negative for congestion, ear pain, sinus pain and sore throat.   Eyes: Negative for discharge and redness.  Respiratory: Negative for cough, hemoptysis, shortness of breath and wheezing.   Cardiovascular: Negative for chest pain.  Gastrointestinal: Negative for abdominal pain, diarrhea, nausea and vomiting.  Genitourinary: Negative for dysuria, flank pain and hematuria.  Musculoskeletal: Negative for myalgias.  Skin: Positive for itching and rash.  Neurological: Negative for dizziness, weakness and  headaches.  Psychiatric/Behavioral: Negative for depression and substance abuse.    Objective:   Vitals: BP (!) 159/103 (BP Location: Right Arm)   Pulse 99   Temp 98.6 F (37 C) (Oral)   Resp 18   SpO2 99%   BP was 136/97 on recheck by PA-Altair Stanko.   Physical Exam Constitutional:      General: She is not in acute distress.    Appearance: Normal appearance. She is well-developed. She is not ill-appearing, toxic-appearing or diaphoretic.  HENT:     Head: Normocephalic and atraumatic.     Nose: Nose normal.     Mouth/Throat:     Mouth: Mucous membranes are moist.     Pharynx: Oropharynx is clear.     Comments: Airway patent.  No oral or facial swelling. Eyes:     General: No scleral icterus.    Extraocular Movements: Extraocular movements intact.     Pupils: Pupils are equal, round, and reactive to light.  Cardiovascular:     Rate and Rhythm: Normal rate and regular rhythm.     Pulses: Normal pulses.     Heart sounds: Normal heart sounds. No murmur. No friction rub. No gallop.   Pulmonary:     Effort: Pulmonary effort is normal. No respiratory distress.     Breath sounds: Normal breath sounds. No stridor. No wheezing, rhonchi or rales.  Skin:    General: Skin is warm and dry.     Findings: Rash (Patch of urticarial lesions over her left shoulder and left volar surface of wrist) present.  Neurological:  General: No focal deficit present.     Mental Status: She is alert and oriented to person, place, and time.  Psychiatric:        Mood and Affect: Mood normal.        Behavior: Behavior normal.        Thought Content: Thought content normal.     Assessment and Plan :   1. Hives   2. Itching     Unclear etiology, recommend the patient avoid eating any salmon that she has leftover.  She was reluctantly agreeable.  Counseled on going back to her routine and avoiding new exposures.  Blood pressure recheck was reassuring, airway is patent.  Will have patient use  hydroxyzine for now as her hives are occurring in different places and resolved with antihistamine. Counseled patient on potential for adverse effects with medications prescribed/recommended today, ER and return-to-clinic precautions discussed, patient verbalized understanding.    Jaynee Eagles, PA-C 08/05/19 2002

## 2019-08-29 ENCOUNTER — Ambulatory Visit: Payer: 59 | Admitting: Family Medicine

## 2019-08-29 NOTE — Progress Notes (Signed)
NO SHOW

## 2019-10-13 ENCOUNTER — Other Ambulatory Visit: Payer: Self-pay

## 2019-10-13 ENCOUNTER — Encounter (HOSPITAL_COMMUNITY): Payer: Self-pay

## 2019-10-13 ENCOUNTER — Ambulatory Visit (HOSPITAL_COMMUNITY)
Admission: EM | Admit: 2019-10-13 | Discharge: 2019-10-13 | Disposition: A | Discharge status: Home / Self Care | Payer: 59 | Attending: Family Medicine | Admitting: Family Medicine

## 2019-10-13 DIAGNOSIS — H6122 Impacted cerumen, left ear: Secondary | ICD-10-CM | POA: Diagnosis not present

## 2019-10-13 DIAGNOSIS — H9192 Unspecified hearing loss, left ear: Secondary | ICD-10-CM

## 2019-10-13 NOTE — Discharge Instructions (Addendum)
We cleaned out the ear You can use debrox OTC as needed.

## 2019-10-13 NOTE — ED Triage Notes (Signed)
Pt presents to the UC with hearing decrease in her left ear x 2 weeks.

## 2019-10-16 NOTE — ED Provider Notes (Signed)
[facility]    CSN: [id number] Arrival date & time: 10/13/19  1143      History   Chief Complaint Chief Complaint  Patient presents with  . Hearing Problem    HPI Tracy Johnson is a 34 y.o. female.   Patient is a 34 year old female who presents with decreased hearing and fullness in the left ear x2-week.  Symptoms been constant.  Reporting history of earwax buildup.  Denies any pain in the ear.  Denies any fever, nasal congestion, rhinorrhea.  Attempted over-the-counter eardrops without much relief.  ROS per HPI      Past Medical History:  Diagnosis Date  . High cholesterol   . Migraine   . Obesity (BMI 30.0-34.9)   . Prediabetes     Patient Active Problem List   Diagnosis Date Noted  . Prediabetes 08/12/2018  . Obesity (BMI 30.0-34.9) 08/12/2018  . Iron deficiency anemia 07/26/2018  . High cholesterol 07/26/2018  . Migraines 07/26/2018    Past Surgical History:  Procedure Laterality Date  . none      OB History   No obstetric history on file.      Home Medications    Prior to Admission medications   Medication Sig Start Date End Date Taking? Authorizing Provider  hydrOXYzine (ATARAX/VISTARIL) 25 MG tablet Take 0.5-1 tablets (12.5-25 mg total) by mouth every 8 (eight) hours as needed for itching. 08/05/19   Jaynee Eagles, PA-C  Multiple Vitamins-Minerals (MULTIVITAMIN GUMMIES WOMENS PO) Take by mouth.    [provider]  norethindrone-ethinyl estradiol (JUNEL FE,GILDESS FE,LOESTRIN FE) 1-20 MG-MCG tablet Take 1 tablet by mouth daily.    [provider]  Vitamin D, Ergocalciferol, (DRISDOL) 50000 units CAPS capsule Take 1 capsule (50,000 Units total) by mouth every 7 (seven) days. 08/11/18   Caren Macadam, MD    Family History Family History  Problem Relation Age of Onset  . Hypertension Mother   . Arthritis Mother   . COPD Mother   . Diabetes Mother   . High Cholesterol Mother   . Arthritis Maternal  Grandmother   . Alzheimer's disease Maternal Grandmother     Social History Social History   Tobacco Use  . Smoking status: Passive Smoke Exposure - Never Smoker  . Smokeless tobacco: Never Used  Substance Use Topics  . Alcohol use: Yes    Comment: occasional  . Drug use: No     Allergies   Patient has no known allergies.   Review of Systems Review of Systems   Physical Exam Triage Vital Signs ED Triage Vitals  Enc Vitals Group     BP 10/13/19 1303 122/83     Pulse Rate 10/13/19 1303 89     Resp 10/13/19 1303 16     Temp --      Temp Source 10/13/19 1303 Oral     SpO2 10/13/19 1303 99 %     Weight --      Height --      Head Circumference --      Peak Flow --      Pain Score 10/13/19 1301 0     Pain Loc --      Pain Edu? --      Excl. in [place]? --    No data found.  Updated Vital Signs BP 122/83 (BP Location: Right Arm)   Pulse 89   Resp 16   SpO2 99%   Visual Acuity Right Eye Distance:   Left Eye Distance:  Bilateral Distance:    Right Eye Near:   Left Eye Near:    Bilateral Near:     Physical Exam Vitals and nursing note reviewed.  Constitutional:      Appearance: Normal appearance.  HENT:     Head: Normocephalic and atraumatic.     Right Ear: Tympanic membrane, ear canal and external ear normal.     Left Ear: There is impacted cerumen.     Nose: Nose normal.  Eyes:     Conjunctiva/sclera: Conjunctivae normal.  Pulmonary:     Effort: Pulmonary effort is normal.  Musculoskeletal:        General: Normal range of motion.     Cervical back: Normal range of motion.  Skin:    General: Skin is warm and dry.  Neurological:     Mental Status: She is alert.  Psychiatric:        Mood and Affect: Mood normal.      UC Treatments / Results  Labs (all labs ordered are listed, but only abnormal results are displayed) Labs Reviewed - No data to display  EKG   Radiology No results found.  Procedures Procedures (including critical care  time)  Medications Ordered in UC Medications - No data to display  Initial Impression / Assessment and Plan / UC Course  I have reviewed the triage vital signs and the nursing notes.  Pertinent labs & imaging results that were available during my care of the patient were reviewed by me and considered in my medical decision making (see chart for details).     Impacted cerumen of the left ear Ear flush done in clinic with relief of symptoms for patient. Ear reassessed and without infection Recommended Debrox Final Clinical Impressions(s) / UC Diagnoses   Final diagnoses:  Impacted cerumen of left ear     Discharge Instructions     We cleaned out the ear You can use debrox OTC as needed.     ED Prescriptions    None     PDMP not reviewed this encounter.   Janace Aris, NP 10/16/19 1458

## 2019-10-26 ENCOUNTER — Ambulatory Visit: Payer: 59 | Admitting: Family Medicine

## 2019-10-26 ENCOUNTER — Other Ambulatory Visit: Payer: Self-pay

## 2019-10-26 DIAGNOSIS — E559 Vitamin D deficiency, unspecified: Secondary | ICD-10-CM | POA: Insufficient documentation

## 2019-10-26 NOTE — Progress Notes (Signed)
Arrived 24 min late for appointment; unable to be seen.

## 2019-11-17 ENCOUNTER — Other Ambulatory Visit: Payer: Self-pay

## 2019-11-18 ENCOUNTER — Ambulatory Visit (INDEPENDENT_AMBULATORY_CARE_PROVIDER_SITE_OTHER): Payer: 59 | Admitting: Family Medicine

## 2019-11-18 ENCOUNTER — Encounter: Payer: Self-pay | Admitting: Family Medicine

## 2019-11-18 VITALS — BP 130/84 | HR 93 | Temp 97.6°F | Ht 60.0 in | Wt 204.3 lb

## 2019-11-18 DIAGNOSIS — E538 Deficiency of other specified B group vitamins: Secondary | ICD-10-CM | POA: Diagnosis not present

## 2019-11-18 DIAGNOSIS — D509 Iron deficiency anemia, unspecified: Secondary | ICD-10-CM

## 2019-11-18 DIAGNOSIS — E559 Vitamin D deficiency, unspecified: Secondary | ICD-10-CM

## 2019-11-18 DIAGNOSIS — E669 Obesity, unspecified: Secondary | ICD-10-CM

## 2019-11-18 DIAGNOSIS — E7800 Pure hypercholesterolemia, unspecified: Secondary | ICD-10-CM | POA: Diagnosis not present

## 2019-11-18 DIAGNOSIS — R7303 Prediabetes: Secondary | ICD-10-CM | POA: Diagnosis not present

## 2019-11-18 DIAGNOSIS — G629 Polyneuropathy, unspecified: Secondary | ICD-10-CM

## 2019-11-18 LAB — COMPREHENSIVE METABOLIC PANEL
ALT: 40 U/L — ABNORMAL HIGH (ref 0–35)
AST: 36 U/L (ref 0–37)
Albumin: 4.2 g/dL (ref 3.5–5.2)
Alkaline Phosphatase: 112 U/L (ref 39–117)
BUN: 10 mg/dL (ref 6–23)
CO2: 29 mEq/L (ref 19–32)
Calcium: 9.4 mg/dL (ref 8.4–10.5)
Chloride: 106 mEq/L (ref 96–112)
Creatinine, Ser: 0.75 mg/dL (ref 0.40–1.20)
GFR: 108.44 mL/min (ref >60.00)
Glucose, Bld: 86 mg/dL (ref 70–99)
Potassium: 4.1 mEq/L (ref 3.5–5.1)
Sodium: 142 mEq/L (ref 135–145)
Total Bilirubin: 0.3 mg/dL (ref 0.2–1.2)
Total Protein: 7.4 g/dL (ref 6.0–8.3)

## 2019-11-18 LAB — CBC WITH DIFFERENTIAL/PLATELET
Basophils Absolute: 0 10*3/uL (ref 0.0–0.1)
Basophils Relative: 0.8 % (ref 0.0–3.0)
Eosinophils Absolute: 0.1 10*3/uL (ref 0.0–0.7)
Eosinophils Relative: 2 % (ref 0.0–5.0)
HCT: 39.5 % (ref 36.0–46.0)
Hemoglobin: 12.8 g/dL (ref 12.0–15.0)
Lymphocytes Relative: 39.4 % (ref 12.0–46.0)
Lymphs Abs: 2.4 10*3/uL (ref 0.7–4.0)
MCHC: 32.3 g/dL (ref 30.0–36.0)
MCV: 84 fl (ref 78.0–100.0)
Monocytes Absolute: 0.4 10*3/uL (ref 0.1–1.0)
Monocytes Relative: 6.8 % (ref 3.0–12.0)
Neutro Abs: 3.1 10*3/uL (ref 1.4–7.7)
Neutrophils Relative %: 51 % (ref 43.0–77.0)
Platelets: 340 10*3/uL (ref 150.0–400.0)
RBC: 4.71 Mil/uL (ref 3.87–5.11)
RDW: 14.7 % (ref 11.5–15.5)
WBC: 6.1 10*3/uL (ref 4.0–10.5)

## 2019-11-18 LAB — LIPID PANEL
Cholesterol: 165 mg/dL (ref 0–200)
HDL: 26.5 mg/dL — ABNORMAL LOW (ref >39.00)
LDL Cholesterol: 104 mg/dL — ABNORMAL HIGH (ref 0–99)
NonHDL: 138.65
Total CHOL/HDL Ratio: 6
Triglycerides: 171 mg/dL — ABNORMAL HIGH (ref 0.0–149.0)
VLDL: 34.2 mg/dL (ref 0.0–40.0)

## 2019-11-18 LAB — VITAMIN D 25 HYDROXY (VIT D DEFICIENCY, FRACTURES): VITD: 7.23 ng/mL — ABNORMAL LOW (ref 30.00–100.00)

## 2019-11-18 LAB — IBC + FERRITIN
Ferritin: 45.1 ng/mL (ref 10.0–291.0)
Iron: 38 ug/dL — ABNORMAL LOW (ref 42–145)
Saturation Ratios: 12.4 % — ABNORMAL LOW (ref 20.0–50.0)
Transferrin: 219 mg/dL (ref 212.0–360.0)

## 2019-11-18 LAB — VITAMIN B12: Vitamin B-12: 371 pg/mL (ref 211–911)

## 2019-11-18 LAB — HEMOGLOBIN A1C: Hgb A1c MFr Bld: 6.3 % (ref 4.6–6.5)

## 2019-11-18 LAB — TSH: TSH: 1.73 u[IU]/mL (ref 0.35–4.50)

## 2019-11-18 NOTE — Addendum Note (Signed)
Addended by: Philemon Kingdom on: 11/18/2019 02:27 PM   Modules accepted: Orders

## 2019-11-18 NOTE — Progress Notes (Signed)
Tracy Johnson DOB: October 21, 1985 Encounter date: 11/18/2019  This is a 34 y.o. female who presents with Chief Complaint  Patient presents with  . 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   Her last visit with me was in October/2019.  At that time we talked about a new diagnosis of diabetes.  She was going to work on exercise in the morning.  We discussed healthier eating at that time.  She is due for recheck on blood work to get better idea of sugar control.  She was going to work on healthier eating to help with her elevated cholesterol as well.  Has switched up eating, has been in the gym. Feels like number keeps climbing - feels like she can see difference in terms of inches that have improved, but "number keeps going up". Randomly also getting parts of hand that are red, numb, tingling. Also has noticed this on outside of foot. Coming and going.   Drinking more water. Working on carbohydrate intake.  Treadmill, stair stepper, rowing, doing squats, leg exercises, stations.   Found self doing intermittent fasting. Has been trying to watch portions. Eats yogurt/fruit and then lunch at 1:30; then doesn't have much for dinner. Usually not really eating dinner. Typical lunch could include spaghetti.   Has lost 5 inches off waist: waist 41in last week. 46 inches at start of exercise; but has only gained weight.  No Known Allergies Current Meds  Medication Sig  . Multiple Vitamins-Minerals (MULTIVITAMIN GUMMIES WOMENS PO) Take by mouth.  . norethindrone-ethinyl estradiol (JUNEL FE,GILDESS FE,LOESTRIN FE) 1-20 MG-MCG tablet Take 1 tablet by mouth daily.    Review of Systems  Objective:  BP 130/84 (BP Location: Left Arm, Patient Position: Sitting, Cuff Size: Large)   Pulse 93   Temp 97.6 F (36.4 C) (Temporal)   Ht 5' (1.524 m)   Wt 204 lb 4.8 oz (92.7 kg)   SpO2 96%   BMI 39.90 kg/m   Weight: 204 lb 4.8 oz (92.7 kg)    BP Readings from Last 3 Encounters:  11/18/19 130/84  10/13/19 122/83  08/05/19 (!) 159/103   Wt Readings from Last 3 Encounters:  11/18/19 204 lb 4.8 oz (92.7 kg)  08/11/18 199 lb 6.4 oz (90.4 kg)  07/26/18 198 lb 12.8 oz (90.2 kg)    Physical Exam Constitutional:      General: She is not in acute distress.    Appearance: She is well-developed.  HENT:     Head: Normocephalic and atraumatic.     Right Ear: External ear normal.     Left Ear: External ear normal.     Mouth/Throat:     Pharynx: No oropharyngeal exudate.  Eyes:     Conjunctiva/sclera: Conjunctivae normal.     Pupils: Pupils are equal, round, and reactive to light.  Neck:     Thyroid: No thyromegaly.  Cardiovascular:     Rate and Rhythm: Normal rate and regular rhythm.     Heart sounds: Normal heart sounds. No murmur. No friction rub. No gallop.   Pulmonary:     Effort: Pulmonary effort is normal.     Breath sounds: Normal breath sounds.  Abdominal:     General: Bowel sounds are normal. There is no distension.     Palpations: Abdomen is soft. There is no mass.     Tenderness: There is no abdominal tenderness. There is no guarding.  Hernia: No hernia is present.  Musculoskeletal:        General: No tenderness or deformity. Normal range of motion.     Cervical back: Normal range of motion and neck supple.  Lymphadenopathy:     Cervical: No cervical adenopathy.  Skin:    General: Skin is warm and dry.     Findings: No rash.     Comments: Normal monofilament exam bilat feet; sensation normal hands, feet  Neurological:     Mental Status: She is alert and oriented to person, place, and time.     Deep Tendon Reflexes: Reflexes normal.     Reflex Scores:      Tricep reflexes are 2+ on the right side and 2+ on the left side.      Bicep reflexes are 2+ on the right side and 2+ on the left side.      Brachioradialis reflexes are 2+ on the right side and 2+ on the left side.      Patellar reflexes are 2+ on  the right side and 2+ on the left side. Psychiatric:        Speech: Speech normal.        Behavior: Behavior normal.        Thought Content: Thought content normal.     Assessment/Plan  1. Prediabetes Recheck bloodwork - Hemoglobin A1c; Future  2. High cholesterol - Lipid panel; Future - Comprehensive metabolic panel; Future - TSH; Future  3. Obesity (BMI 30.0-34.9) She is working on [employer], regular exercise. I did encourage her to count calories over weekend to see approx daily intake to get ballpark of where she is. Follow up pending bloodwork.  4. Vitamin D deficiency - VITAMIN D 25 Hydroxy (Vit-D Deficiency, Fractures); Future  5. Iron deficiency anemia, unspecified iron deficiency anemia type - CBC with Differential/Platelet; Future - IBC + Ferritin; Future  6. B12 deficiency - Vitamin B12; Future  7. Neuropathy Checking b12 as well as iron, sugar.   Return for pending bloodwork.    due for physical, but motivated to work on weight loss. We will plan for this pending bloodwork results.    Micheline Rough, MD

## 2019-11-22 MED ORDER — VITAMIN D (ERGOCALCIFEROL) 1.25 MG (50000 UNIT) PO CAPS: 50000.0000 [IU] | ORAL_CAPSULE | ORAL | 1 refills | Status: DC

## 2019-11-22 NOTE — Addendum Note (Signed)
Addended by: Johnella Moloney on: 11/22/2019 03:25 PM   Modules accepted: Orders

## 2019-11-25 ENCOUNTER — Encounter: Payer: Self-pay | Admitting: Family Medicine

## 2019-12-01 ENCOUNTER — Other Ambulatory Visit: Payer: Self-pay

## 2019-12-02 ENCOUNTER — Ambulatory Visit (INDEPENDENT_AMBULATORY_CARE_PROVIDER_SITE_OTHER): Payer: 59 | Admitting: Family Medicine

## 2019-12-02 ENCOUNTER — Encounter: Payer: Self-pay | Admitting: Family Medicine

## 2019-12-02 ENCOUNTER — Telehealth: Payer: 59 | Admitting: Family Medicine

## 2019-12-02 VITALS — BP 122/82 | HR 85 | Temp 97.7°F | Ht 60.0 in | Wt 199.6 lb

## 2019-12-02 DIAGNOSIS — E669 Obesity, unspecified: Secondary | ICD-10-CM

## 2019-12-02 NOTE — Patient Instructions (Signed)
Fish oil 3-4 grams daily (or 3000-4000mg )

## 2019-12-02 NOTE — Progress Notes (Signed)
Tracy Johnson DOB: September 02, 1986 Encounter date: 12/02/2019  This is a 34 y.o. female who presents with Chief Complaint  Patient presents with  . Weight Loss    History of present illness: Has been keeping up with regular exercise - increasing weight lifting. Still working on healthier eating. Most of the time goal at 1200 cal/day.   This morning weight was 197.   Would like to get down to 155lb. Last time she weighed 155lb was sometime in middle school - maybe age 15.   No Known Allergies Current Meds  Medication Sig  . Multiple Vitamins-Minerals (MULTIVITAMIN GUMMIES WOMENS PO) Take by mouth.  . norethindrone-ethinyl estradiol (JUNEL FE,GILDESS FE,LOESTRIN FE) 1-20 MG-MCG tablet Take 1 tablet by mouth daily.  . Vitamin D, Ergocalciferol, (DRISDOL) 1.25 MG (50000 UNIT) CAPS capsule Take 1 capsule (50,000 Units total) by mouth every 7 (seven) days.    Review of Systems  Constitutional: Negative for chills, fatigue and fever.  Respiratory: Negative for cough, chest tightness, shortness of breath and wheezing.   Cardiovascular: Negative for chest pain, palpitations and leg swelling.    Objective:  BP 122/82 (BP Location: Left Arm, Patient Position: Sitting, Cuff Size: Large)   Pulse 85   Temp 97.7 F (36.5 C) (Temporal)   Ht 5' (1.524 m)   Wt 199 lb 9.6 oz (90.5 kg)   SpO2 97%   BMI 38.98 kg/m   Weight: 199 lb 9.6 oz (90.5 kg)   BP Readings from Last 3 Encounters:  12/02/19 122/82  11/18/19 130/84  10/13/19 122/83   Wt Readings from Last 3 Encounters:  12/02/19 199 lb 9.6 oz (90.5 kg)  11/18/19 204 lb 4.8 oz (92.7 kg)  08/11/18 199 lb 6.4 oz (90.4 kg)    Physical Exam Constitutional:      General: She is not in acute distress.    Appearance: She is well-developed.  Pulmonary:     Effort: Pulmonary effort is normal.  Musculoskeletal:     Right lower leg: No edema.     Left lower leg: No edema.  Neurological:     Mental Status: She is alert and oriented  to person, place, and time.  Psychiatric:        Mood and Affect: Mood normal.        Behavior: Behavior normal.        Thought Content: Thought content normal.     Assessment/Plan 1. Obesity (BMI 30.0-34.9) Macy has done a great job since her last visit of getting started with weight loss.  She is monitoring her caloric intake and trying to stick around 1200 cal daily.  We reviewed this and how exercise plays an important role.  We discussed specific plans for how to improve benefit of exercise including adding in higher intensity intervals.  We discussed specific exercises that she is doing at the gym and how to adjust these to increase heart rate.  We discussed medication options for weight loss, but with her initial success following healthy eating habits and exercise, I do not feel that medication is indicated at this point.  She will give me an update via MyChart with weight loss and let me know if she is having any difficulty continuing along this road.  We reviewed low carbohydrate eating as well as her elevated A1c since following more of a diabetic diet will likely be most helpful for her in terms of weight loss.  She is extremely motivated to continue this weight loss.  Additionally, reviewed  all her blood work together.  We discussed cholesterol.  She has a low HDL.  We discussed having a diet higher in fish oil she is also vitamin D deficient, which we have sent in a supplement for her to replace.  Her current energy level is quite good.    Return for pending update.(she will update in 1 month)     Theodis Shove, MD

## 2019-12-23 ENCOUNTER — Encounter: Payer: Self-pay | Admitting: Family Medicine

## 2020-04-21 ENCOUNTER — Emergency Department (HOSPITAL_COMMUNITY)
Admission: EM | Admit: 2020-04-21 | Discharge: 2020-04-21 | Disposition: A | Discharge status: Home / Self Care | Payer: 59 | Attending: Emergency Medicine | Admitting: Emergency Medicine

## 2020-04-21 ENCOUNTER — Encounter (HOSPITAL_COMMUNITY): Payer: Self-pay

## 2020-04-21 ENCOUNTER — Other Ambulatory Visit: Payer: Self-pay

## 2020-04-21 DIAGNOSIS — T7840XA Allergy, unspecified, initial encounter: Secondary | ICD-10-CM | POA: Diagnosis not present

## 2020-04-21 DIAGNOSIS — R21 Rash and other nonspecific skin eruption: Secondary | ICD-10-CM | POA: Diagnosis present

## 2020-04-21 NOTE — Discharge Instructions (Signed)
You may apply ice, take Claritin or Zyrtec's, apply Benadryl cream to the area.  If you notice any changes in the skin, fever, worsening symptoms please return to the emergency department.

## 2020-04-21 NOTE — ED Provider Notes (Signed)
[facility] EMERGENCY DEPARTMENT Provider Note   CSN: [id number] Arrival date & time: 04/21/20  5956     History Chief Complaint  Patient presents with  . Rash    Tracy Johnson is a 34 y.o. female.  34 y.o female with a PMH of Migraine, high cholesterol presents to the ED with a chief complaint of rash times this morning.  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  There is redness isolated to the skin area of the left AC, along with some edema noted.  This is described as pruritic.  She has tried Benadryl without improvement in symptoms.  There is no fever, no limited range of motion of the left elbow, no other complaints.  The history is provided by the patient.       Past Medical History:  Diagnosis Date  . High cholesterol   . Migraine   . Obesity (BMI 30.0-34.9)   . Prediabetes     Patient Active Problem List   Diagnosis Date Noted  . Vitamin D deficiency 10/26/2019  . Prediabetes 08/12/2018  . Obesity (BMI 30.0-34.9) 08/12/2018  . Iron deficiency anemia 07/26/2018  . High cholesterol 07/26/2018  . Migraines 07/26/2018    Past Surgical History:  Procedure Laterality Date  . none       OB History   No obstetric history on file.     Family History  Problem Relation Age of Onset  . Hypertension Mother   . Arthritis Mother   . COPD Mother   . Diabetes Mother   . High Cholesterol Mother   . Arthritis Maternal Grandmother   . Alzheimer's disease Maternal Grandmother     Social History   Tobacco Use  . Smoking status: Passive Smoke Exposure - Never Smoker  . Smokeless tobacco: Never Used  Substance Use Topics  . Alcohol use: Yes    Comment: occasional  . Drug use: No    Home Medications Prior to Admission medications   Medication Sig Start Date End Date Taking? Authorizing Provider  Multiple Vitamins-Minerals (MULTIVITAMIN GUMMIES  WOMENS PO) Take by mouth.    [provider]  norethindrone-ethinyl estradiol (JUNEL FE,GILDESS FE,LOESTRIN FE) 1-20 MG-MCG tablet Take 1 tablet by mouth daily.    [provider]  Vitamin D, Ergocalciferol, (DRISDOL) 1.25 MG (50000 UNIT) CAPS capsule Take 1 capsule (50,000 Units total) by mouth every 7 (seven) days. 11/22/19   Wynn Banker, MD    Allergies    Patient has no known allergies.  Review of Systems   Review of Systems  Constitutional: Negative for fever.  Skin: Positive for color change. Negative for rash and wound.    Physical Exam Updated Vital Signs BP 115/84   Pulse 95   Temp 98.1 F (36.7 C) (Oral)   Resp 18   SpO2 97%   Physical Exam Vitals and nursing note reviewed.  Constitutional:      Appearance: Normal appearance.  HENT:     Head: Normocephalic and atraumatic.  Eyes:     Pupils: Pupils are equal, round, and reactive to light.  Cardiovascular:     Rate and Rhythm: Normal rate.  Pulmonary:     Effort: Pulmonary effort is normal.  Abdominal:     General: Abdomen is flat.  Musculoskeletal:     Cervical back: Normal range of motion and neck supple.  Skin:    General: Skin  is warm and dry.     Findings: Erythema and rash present. Rash is urticarial.          Comments: Mild erythema, swelling noted to the left AC.  No redness or surrounding erythema to the left elbow joint.  Full range of motion.  No vesicles noted.  Neurological:     Mental Status: She is alert and oriented to person, place, and time.     ED Results / Procedures / Treatments   Labs (all labs ordered are listed, but only abnormal results are displayed) Labs Reviewed - No data to display  EKG None  Radiology No results found.  Procedures Procedures (including critical care time)  Medications Ordered in ED Medications - No data to display  ED Course  I have reviewed the triage vital signs and the nursing notes.  Pertinent labs & imaging results  that were available during my care of the patient were reviewed by me and considered in my medical decision making (see chart for details).    MDM Rules/Calculators/A&P   Patient with no pertinent past medical history presents to the ED with a chief complaint of rash to the left [facility].  Reports she was working yesterday, unknown whether she got bit by anything.  She reports taking Benadryl without improvement in symptoms, there is swelling noted to the subcutaneous region along the left [facility].  She has full range of motion of her left elbow, no joint involvement.  Vitals are within normal limits, she is afebrile.  There are no changes in the skin consistent with streaking, lower suspicion for cellulitis.  We discussed treatment with Zyrtec, Claritin and Benadryl cream to help with symptoms.  No systemic signs on today's visit.  Patient understands and agrees to management, return precautions discussed at length.   Portions of this note were generated with Scientist, clinical (histocompatibility and immunogenetics). Dictation errors may occur despite best attempts at proofreading.  Final Clinical Impression(s) / ED Diagnoses Final diagnoses:  Allergic reaction, initial encounter    Rx / DC Orders ED Discharge Orders    None       Claude Manges, Cordelia Poche 04/21/20 0919    Linwood Dibbles, MD 04/21/20 [phone]

## 2020-04-21 NOTE — ED Notes (Signed)
Pt d/c home per MD order.  Discharge summary reviewed with pt, pt verbalizes understanding., no s.s of acute distress noted, ambulatory off unit.

## 2020-04-21 NOTE — ED Triage Notes (Signed)
Patient complains of itching to left AC area of arm since am, mild redness and swelling noted, thinks she is having a reaction to something

## 2020-06-25 ENCOUNTER — Ambulatory Visit: Payer: 59 | Admitting: Family Medicine

## 2020-06-27 ENCOUNTER — Telehealth (INDEPENDENT_AMBULATORY_CARE_PROVIDER_SITE_OTHER): Payer: 59 | Admitting: Internal Medicine

## 2020-06-27 ENCOUNTER — Other Ambulatory Visit: Payer: Self-pay

## 2020-06-27 ENCOUNTER — Encounter: Payer: Self-pay | Admitting: Internal Medicine

## 2020-06-27 VITALS — Ht 60.0 in | Wt 200.0 lb

## 2020-06-27 DIAGNOSIS — L509 Urticaria, unspecified: Secondary | ICD-10-CM | POA: Diagnosis not present

## 2020-06-27 NOTE — Progress Notes (Signed)
Virtual Visit via Video Note  I connected with@ on 06/27/20 at 12:30 PM EDT by a video enabled telemedicine application and verified that I am speaking with the correct person using two identifiers. Location patient: home Location provider:work  office Persons participating in the virtual visit: patient, provider  [place] national recommendations  regarding COVID 19 pandemic   video visit is advised over in office visit for this patient.  Patient aware  of the limitations of evaluation and management by telemedicine and  availability of in person appointments. and agreed to proceed.   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   benadryl and rub  Benadryl  Then goes away in about 30 minutes  And can be the next .  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 No weheezing cough fever gi gu sx   Past Medical History:  Diagnosis Date  . High cholesterol   . Migraine   . Obesity (BMI 30.0-34.9)   . Prediabetes     Past Surgical History:  Procedure Laterality Date  . none      Family History  Problem Relation Age of Onset  . Hypertension Mother   . Arthritis Mother   . COPD Mother   . Diabetes Mother   . High Cholesterol Mother   . Arthritis Maternal Grandmother   . Alzheimer's disease Maternal Grandmother     Social History   Tobacco Use  . Smoking status: Passive Smoke Exposure - Never Smoker  . Smokeless tobacco: Never Used  Vaping Use  . Vaping Use: Never used  Substance Use Topics  . Alcohol use: Yes    Comment: occasional  . Drug  use: No      Current Outpatient Medications:  .  clindamycin (CLEOCIN T) 1 % external solution, clindamycin phosphate 1 % topical solution, Disp: , Rfl:  .  Multiple Vitamins-Minerals (MULTIVITAMIN GUMMIES WOMENS PO), Take by mouth., Disp: , Rfl:  .  norethindrone-ethinyl estradiol (JUNEL FE,GILDESS FE,LOESTRIN FE) 1-20 MG-MCG tablet, Take 1 tablet by mouth daily. (Patient not taking: Reported on 06/27/2020), Disp: , Rfl:  .  Vitamin D, Ergocalciferol, (DRISDOL) 1.25 MG (50000 UNIT) CAPS capsule, Take 1 capsule (50,000 Units total) by mouth every 7 (seven) days. (Patient not taking: Reported on 06/27/2020), Disp: 12 capsule, Rfl: 1  EXAM: BP Readings from Last 3 Encounters:  04/21/20 109/81  12/02/19 122/82  11/18/19 130/84    VITALS per patient if applicable:  GENERAL: alert, oriented, appears well and in no acute distress  HEENT: atraumatic, conjunttiva clear, no obvious abnormalities on inspection of external nose and ears  NECK: normal movements of the head and neck  LUNGS: on inspection no signs of respiratory distress, breathing rate appears normal, no obvious gross SOB, gasping or wheezing  CV: no obvious cyanosis Skin during   Visit right  arm forearm a 3 cm large wheal noted   No angioedema  MS: moves all visible extremities without noticeable abnormality  PSYCH/NEURO: pleasant and cooperative, no obvious depression or anxiety, speech and thought processing grossly intact   ASSESSMENT AND PLAN:  Discussed the following assessment and plan:    ICD-10-CM   1. Urticaria  L50.9    uncertain triger  intermittent responds to benadry no systemic sx   Take zyrtec or   xyzal every day for suppression and can add on benadryl 25- 50 mg  As needed up to every 6 hours   Send in pix Give [place] an update in 7- 10 days if not resolved consider further eval  steroid etc  No systemic sx and not seemingly assoc with food ? Not red meat uncertain if fish etc  Heat seems to be a maybe  trigger.  Counseled.  On further review after visit over  There was UC visit for hive reaction in 10 20 rx antihistamine  And a bite type rash  Ed  7 10  21  That resolved on own taking benadryl.   Expectant management and discussion of plan and treatment with opportunity to ask questions and all were answered. The patient agreed with the plan and demonstrated an understanding of the instructions.  30 minutes record review  And plan and counsel  Advised to call back or seek an in-person evaluation if worsening  or having  further concerns . In interim   Will send in pix  For record  No follow-ups on file.   , MD

## 2020-07-09 ENCOUNTER — Encounter: Payer: Self-pay | Admitting: Family Medicine

## 2020-07-09 DIAGNOSIS — L509 Urticaria, unspecified: Secondary | ICD-10-CM

## 2020-08-01 ENCOUNTER — Ambulatory Visit: Payer: 59 | Admitting: Family Medicine

## 2020-08-28 ENCOUNTER — Ambulatory Visit: Payer: 59 | Admitting: Allergy & Immunology

## 2020-10-18 ENCOUNTER — Ambulatory Visit: Payer: 59 | Admitting: Allergy & Immunology

## 2020-10-18 ENCOUNTER — Telehealth: Payer: Self-pay

## 2020-10-18 NOTE — Telephone Encounter (Signed)
Called and left a message for patient to call and reschedule her missed appointment.

## 2020-11-07 ENCOUNTER — Other Ambulatory Visit: Payer: Self-pay

## 2020-11-07 ENCOUNTER — Ambulatory Visit (INDEPENDENT_AMBULATORY_CARE_PROVIDER_SITE_OTHER): Payer: Self-pay | Admitting: *Deleted

## 2020-11-07 DIAGNOSIS — Z23 Encounter for immunization: Secondary | ICD-10-CM

## 2020-11-09 LAB — TB SKIN TEST
Induration: 0 mm
TB Skin Test: NEGATIVE

## 2021-08-05 ENCOUNTER — Encounter: Payer: Self-pay | Admitting: Family Medicine

## 2021-08-09 ENCOUNTER — Encounter: Payer: Self-pay | Admitting: Family Medicine

## 2021-08-09 ENCOUNTER — Ambulatory Visit (INDEPENDENT_AMBULATORY_CARE_PROVIDER_SITE_OTHER): Payer: Self-pay | Admitting: Family Medicine

## 2021-08-09 ENCOUNTER — Other Ambulatory Visit: Payer: Self-pay

## 2021-08-09 VITALS — BP 110/72 | HR 110 | Temp 98.2°F | Ht 60.0 in | Wt 199.4 lb

## 2021-08-09 DIAGNOSIS — R7303 Prediabetes: Secondary | ICD-10-CM | POA: Diagnosis not present

## 2021-08-09 DIAGNOSIS — E349 Endocrine disorder, unspecified: Secondary | ICD-10-CM

## 2021-08-09 DIAGNOSIS — D509 Iron deficiency anemia, unspecified: Secondary | ICD-10-CM

## 2021-08-09 DIAGNOSIS — N912 Amenorrhea, unspecified: Secondary | ICD-10-CM | POA: Diagnosis not present

## 2021-08-09 DIAGNOSIS — E7800 Pure hypercholesterolemia, unspecified: Secondary | ICD-10-CM | POA: Diagnosis not present

## 2021-08-09 DIAGNOSIS — L732 Hidradenitis suppurativa: Secondary | ICD-10-CM

## 2021-08-09 DIAGNOSIS — E559 Vitamin D deficiency, unspecified: Secondary | ICD-10-CM | POA: Diagnosis not present

## 2021-08-09 DIAGNOSIS — E669 Obesity, unspecified: Secondary | ICD-10-CM

## 2021-08-09 LAB — HCG, QUANTITATIVE, PREGNANCY: Quantitative HCG: 2.09 m[IU]/mL

## 2021-08-09 LAB — CBC WITH DIFFERENTIAL/PLATELET
Basophils Absolute: 0 10*3/uL (ref 0.0–0.1)
Basophils Relative: 0.3 % (ref 0.0–3.0)
Eosinophils Absolute: 0.2 10*3/uL (ref 0.0–0.7)
Eosinophils Relative: 2.2 % (ref 0.0–5.0)
HCT: 37.5 % (ref 36.0–46.0)
Hemoglobin: 12 g/dL (ref 12.0–15.0)
Lymphocytes Relative: 30.7 % (ref 12.0–46.0)
Lymphs Abs: 2.3 10*3/uL (ref 0.7–4.0)
MCHC: 32.1 g/dL (ref 30.0–36.0)
MCV: 80.3 fl (ref 78.0–100.0)
Monocytes Absolute: 0.5 10*3/uL (ref 0.1–1.0)
Monocytes Relative: 7.3 % (ref 3.0–12.0)
Neutro Abs: 4.4 10*3/uL (ref 1.4–7.7)
Neutrophils Relative %: 59.5 % (ref 43.0–77.0)
Platelets: 323 10*3/uL (ref 150.0–400.0)
RBC: 4.67 Mil/uL (ref 3.87–5.11)
RDW: 16.4 % — ABNORMAL HIGH (ref 11.5–15.5)
WBC: 7.4 10*3/uL (ref 4.0–10.5)

## 2021-08-09 LAB — COMPREHENSIVE METABOLIC PANEL
ALT: 33 U/L (ref 0–35)
AST: 26 U/L (ref 0–37)
Albumin: 4.3 g/dL (ref 3.5–5.2)
Alkaline Phosphatase: 112 U/L (ref 39–117)
BUN: 10 mg/dL (ref 6–23)
CO2: 31 mEq/L (ref 19–32)
Calcium: 9.4 mg/dL (ref 8.4–10.5)
Chloride: 102 mEq/L (ref 96–112)
Creatinine, Ser: 0.76 mg/dL (ref 0.40–1.20)
GFR: 102.82 mL/min (ref >60.00)
Glucose, Bld: 102 mg/dL — ABNORMAL HIGH (ref 70–99)
Potassium: 3.5 mEq/L (ref 3.5–5.1)
Sodium: 140 mEq/L (ref 135–145)
Total Bilirubin: 0.3 mg/dL (ref 0.2–1.2)
Total Protein: 8.2 g/dL (ref 6.0–8.3)

## 2021-08-09 LAB — LIPID PANEL
Cholesterol: 168 mg/dL (ref 0–200)
HDL: 24.6 mg/dL — ABNORMAL LOW (ref >39.00)
LDL Cholesterol: 105 mg/dL — ABNORMAL HIGH (ref 0–99)
NonHDL: 143.49
Total CHOL/HDL Ratio: 7
Triglycerides: 190 mg/dL — ABNORMAL HIGH (ref 0.0–149.0)
VLDL: 38 mg/dL (ref 0.0–40.0)

## 2021-08-09 LAB — VITAMIN D 25 HYDROXY (VIT D DEFICIENCY, FRACTURES): VITD: 9.94 ng/mL — ABNORMAL LOW (ref 30.00–100.00)

## 2021-08-09 LAB — IBC + FERRITIN
Ferritin: 42.5 ng/mL (ref 10.0–291.0)
Iron: 33 ug/dL — ABNORMAL LOW (ref 42–145)
Saturation Ratios: 10 % — ABNORMAL LOW (ref 20.0–50.0)
TIBC: 329 ug/dL (ref 250.0–450.0)
Transferrin: 235 mg/dL (ref 212.0–360.0)

## 2021-08-09 LAB — HEMOGLOBIN A1C: Hgb A1c MFr Bld: 6.3 % (ref 4.6–6.5)

## 2021-08-09 MED ORDER — CEPHALEXIN 500 MG PO CAPS: 500.0000 mg | ORAL_CAPSULE | Freq: Three times a day (TID) | ORAL | 0 refills | Status: AC

## 2021-08-09 NOTE — Progress Notes (Signed)
Tracy Johnson DOB: 17-Jul-1986 Encounter date: 08/09/2021  This is a 35 y.o. female who presents with Chief Complaint  Patient presents with   Hidradenitis     Patient complains of Hidradenitis, x3 days, Tried warm compress with little relief    History of present illness:  Boil that was really bad at beginning of week - first flat, tender, red, but now has come to a head. Was red and tender around area and couldn't move arm without it hurting. Now not hurting. No fevers.   Can get migraine just before menses, but generally well controlled.   She ate a very salty meal right before coming to office today. Thinks that could be related to bp. Frustrated with lack of weight change; does feel she is working out regularly and feels that body shape has changed for the positive.   No Known Allergies Current Meds  Medication Sig   cephALEXin (KEFLEX) 500 MG capsule Take 1 capsule (500 mg total) by mouth 3 (three) times daily for 5 days.   clindamycin (CLEOCIN T) 1 % external solution clindamycin phosphate 1 % topical solution    Review of Systems  Constitutional:  Negative for fever.  Skin:  Positive for color change.       See hpi   Objective:  BP 110/72   Pulse (!) 110   Temp 98.2 F (36.8 C) (Oral)   Ht 5' (1.524 m)   Wt 199 lb 6.4 oz (90.4 kg)   SpO2 97%   BMI 38.94 kg/m   Weight: 199 lb 6.4 oz (90.4 kg)   BP Readings from Last 3 Encounters:  08/09/21 110/72  04/21/20 109/81  12/02/19 122/82   Wt Readings from Last 3 Encounters:  08/09/21 199 lb 6.4 oz (90.4 kg)  06/27/20 200 lb (90.7 kg)  12/02/19 199 lb 9.6 oz (90.5 kg)    Physical Exam Constitutional:      General: She is not in acute distress.    Appearance: She is well-developed.  Cardiovascular:     Rate and Rhythm: Normal rate and regular rhythm.     Heart sounds: Normal heart sounds. No murmur heard.   No friction rub.  Pulmonary:     Effort: Pulmonary effort is normal. No respiratory distress.      Breath sounds: Normal breath sounds. No wheezing or rales.  Musculoskeletal:     Right lower leg: No edema.     Left lower leg: No edema.  Skin:    Comments: Diagnosis: abscess - Location: right axilla Procedure: Incision & drainage Informed consent: after discussing options for treatment, patient verbally consented to drainage.   Anesthesia: 1% lido with epi  The area was cleansed with chloraprep. 1cc lido with epi injected over central abscess. Using 11 blade scalpal, small 0.5cm central incision made. Copious purulent material relieved. Patient tolerated well. There remains an area of approx 3cm induration around area of drainage.   The patient tolerated the procedure well. The patient was instructed on post-op care. Continue with warm compresses. If induration not resolving, encouraged to start antibiotic. If any worsening of redness, discomfort, start antibiotic.     Neurological:     Mental Status: She is alert and oriented to person, place, and time.  Psychiatric:        Behavior: Behavior normal.    Assessment/Plan  1. Hidradenitis Abscess drained today in office; see above. Should heal well now that I and D completed, but start antibiotics if not resolving in next  48 hours.  - CBC with Differential/Platelet; Future - CBC with Differential/Platelet  2. Obesity (BMI 30.0-34.9) Start with bloodwork. Follow up after this.   3. Prediabetes - Hemoglobin A1c; Future - Hemoglobin A1c  4. Iron deficiency anemia, unspecified iron deficiency anemia type - IBC + Ferritin; Future - IBC + Ferritin  5. Vitamin D deficiency - VITAMIN D 25 Hydroxy (Vit-D Deficiency, Fractures); Future - VITAMIN D 25 Hydroxy (Vit-D Deficiency, Fractures)  6. High cholesterol - Comprehensive metabolic panel; Future - Lipid panel; Future - Lipid panel - Comprehensive metabolic panel  7. Amenorrhea (Overdue for period; using condoms for contraception) - HCG, Quant, Pregnancy; Future -  HCG, Quant, Pregnancy   Return for pending bloodwork.      Theodis Shove, MD

## 2021-08-09 NOTE — Patient Instructions (Signed)
*  if any worsening of skin - redness, pain, "hard area around center" then start antibiotic. Keep up with hot compresses as often as possible. If you start antibiotic and still feel that area is getting worse; return to office.

## 2021-08-12 ENCOUNTER — Telehealth: Payer: Self-pay | Admitting: Family Medicine

## 2021-08-12 MED ORDER — VITAMIN D (ERGOCALCIFEROL) 1.25 MG (50000 UNIT) PO CAPS: 50000.0000 [IU] | ORAL_CAPSULE | ORAL | 0 refills | Status: DC

## 2021-08-12 NOTE — Telephone Encounter (Signed)
Spoke with the patient and informed her of the results per Dr Caryl Never as he stated to repeat the testing in 1 week for levels.

## 2021-08-12 NOTE — Addendum Note (Signed)
Addended by: Johnella Moloney on: 08/12/2021 03:29 PM   Modules accepted: Orders

## 2021-08-12 NOTE — Telephone Encounter (Signed)
Pt is calling back and does not understand what is the concern with minimally elevated hCG. Pt has been sch for blood work on  08-19-2021

## 2021-08-14 ENCOUNTER — Other Ambulatory Visit: Payer: Self-pay | Admitting: Family Medicine

## 2021-08-19 ENCOUNTER — Other Ambulatory Visit: Payer: Self-pay

## 2021-08-19 ENCOUNTER — Other Ambulatory Visit (INDEPENDENT_AMBULATORY_CARE_PROVIDER_SITE_OTHER): Payer: No Typology Code available for payment source

## 2021-08-19 DIAGNOSIS — E349 Endocrine disorder, unspecified: Secondary | ICD-10-CM | POA: Diagnosis not present

## 2021-08-19 LAB — HCG, QUANTITATIVE, PREGNANCY: Quantitative HCG: <0.6 m[IU]/mL

## 2021-08-30 ENCOUNTER — Ambulatory Visit (INDEPENDENT_AMBULATORY_CARE_PROVIDER_SITE_OTHER): Payer: No Typology Code available for payment source | Admitting: Family Medicine

## 2021-08-30 ENCOUNTER — Encounter: Payer: Self-pay | Admitting: Family Medicine

## 2021-08-30 VITALS — BP 122/80 | HR 94 | Temp 98.1°F | Ht 60.0 in | Wt 200.1 lb

## 2021-08-30 DIAGNOSIS — R7301 Impaired fasting glucose: Secondary | ICD-10-CM

## 2021-08-30 DIAGNOSIS — E669 Obesity, unspecified: Secondary | ICD-10-CM | POA: Diagnosis not present

## 2021-08-30 MED ORDER — SAXENDA 18 MG/3ML ~~LOC~~ SOPN: PEN_INJECTOR | SUBCUTANEOUS | 2 refills | Status: DC

## 2021-08-30 NOTE — Progress Notes (Signed)
  Tracy Johnson DOB: 1986/08/31 Encounter date: 08/30/2021  This is a 35 y.o. female who presents with Chief Complaint  Patient presents with   Weight Check    History of present illness:   Last A1c was 6.3 on 10/20.  Tracy Johnson returns today to discuss weight loss options. She has changed eating habits, she has exercised regularly, she feels better and has noted body shape changes, but has not been able to lose weight.    No Known Allergies Current Meds  Medication Sig   Vitamin D, Ergocalciferol, (DRISDOL) 1.25 MG (50000 UNIT) CAPS capsule Take 1 capsule (50,000 Units total) by mouth every 7 (seven) days.    Review of Systems  Constitutional:  Negative for chills, fatigue and fever.  Respiratory:  Negative for cough, chest tightness, shortness of breath and wheezing.   Cardiovascular:  Negative for chest pain, palpitations and leg swelling.   Objective:  BP 122/80 (BP Location: Right Arm, Patient Position: Sitting, Cuff Size: Large)   Pulse 94   Temp 98.1 F (36.7 C) (Oral)   Ht 5' (1.524 m)   Wt 200 lb 1.6 oz (90.8 kg)   LMP 06/26/2021 (Exact Date)   SpO2 99%   BMI 39.08 kg/m   Weight: 200 lb 1.6 oz (90.8 kg)   BP Readings from Last 3 Encounters:  08/30/21 122/80  08/09/21 110/72  04/21/20 109/81   Wt Readings from Last 3 Encounters:  08/30/21 200 lb 1.6 oz (90.8 kg)  08/09/21 199 lb 6.4 oz (90.4 kg)  06/27/20 200 lb (90.7 kg)    Physical Exam Pulmonary:     Effort: Pulmonary effort is normal.  Neurological:     Mental Status: She is alert.  Psychiatric:        Mood and Affect: Mood normal.    Assessment/Plan  1. Obesity (BMI 35.0-39.9 without comorbidity) We discussed different options to assist with weight loss including weight loss clinic.  She has tried a lot of different dietary interventions and has been exercising regularly.  Metabolic syndrome likely contributing to difficulty with weight loss.  Due to elevated A1c, I do feel a GLP-1  medication would be most beneficial for her to help with weight loss.  We discussed other options including medications like Adipex and Contrave, but she does not have difficulty with controlling eating, more so just with losing weight.  Sample of Saxenda was given to her today.  She will update me with how she is doing with this so that we can refill appropriately.Discussed new medication(s) today with patient. Discussed potential side effects (including black box warning thyroid cancer) and patient verbalized understanding.   2. Impaired fasting glucose See above. Suggested low carb diet, but also just healthy eating, well rounded, esp with saxenda on board to minimize side effects.    Return in about 3 months (around 11/30/2021). She will update me in 1 month.   20 minutes spent with patient, discussion of medication options, chart review, charting, discussion of medication sample, and follow up plan.   Theodis Shove, MD

## 2021-08-30 NOTE — Patient Instructions (Signed)
Update me in 2 weeks time with how you are doing. Let me know if any problems.

## 2021-09-02 ENCOUNTER — Ambulatory Visit: Payer: Self-pay | Admitting: Family Medicine

## 2021-09-04 ENCOUNTER — Ambulatory Visit: Payer: No Typology Code available for payment source | Admitting: Family Medicine

## 2021-09-11 ENCOUNTER — Ambulatory Visit: Payer: Self-pay | Admitting: Family Medicine

## 2021-09-12 ENCOUNTER — Encounter: Payer: Self-pay | Admitting: Family Medicine

## 2022-07-16 ENCOUNTER — Telehealth: Payer: Self-pay | Admitting: Family Medicine

## 2022-07-16 NOTE — Telephone Encounter (Signed)
Called patient to schedule TOC. Patient stated that she will callback to schedule. I let patient know that she currently does not have a PCP and if she needs any medications refill, they may not be refilled until she has an appointment. Patient verbalized understanding.     FYI

## 2022-08-07 DIAGNOSIS — Z6839 Body mass index (BMI) 39.0-39.9, adult: Secondary | ICD-10-CM | POA: Diagnosis not present

## 2022-08-07 DIAGNOSIS — Z01419 Encounter for gynecological examination (general) (routine) without abnormal findings: Secondary | ICD-10-CM | POA: Diagnosis not present

## 2022-08-07 DIAGNOSIS — Z124 Encounter for screening for malignant neoplasm of cervix: Secondary | ICD-10-CM | POA: Diagnosis not present

## 2022-08-07 LAB — HM PAP SMEAR: HM Pap smear: NEGATIVE

## 2022-08-22 ENCOUNTER — Ambulatory Visit (INDEPENDENT_AMBULATORY_CARE_PROVIDER_SITE_OTHER): Payer: BC Managed Care – PPO | Admitting: Family Medicine

## 2022-08-22 ENCOUNTER — Encounter: Payer: Self-pay | Admitting: Family Medicine

## 2022-08-22 VITALS — BP 130/80 | HR 100 | Temp 98.4°F | Ht 60.0 in | Wt 198.4 lb

## 2022-08-22 DIAGNOSIS — D509 Iron deficiency anemia, unspecified: Secondary | ICD-10-CM

## 2022-08-22 DIAGNOSIS — J029 Acute pharyngitis, unspecified: Secondary | ICD-10-CM

## 2022-08-22 DIAGNOSIS — E669 Obesity, unspecified: Secondary | ICD-10-CM | POA: Diagnosis not present

## 2022-08-22 DIAGNOSIS — E559 Vitamin D deficiency, unspecified: Secondary | ICD-10-CM

## 2022-08-22 DIAGNOSIS — R7303 Prediabetes: Secondary | ICD-10-CM | POA: Diagnosis not present

## 2022-08-22 DIAGNOSIS — R059 Cough, unspecified: Secondary | ICD-10-CM

## 2022-08-22 LAB — COMPREHENSIVE METABOLIC PANEL
ALT: 27 U/L (ref 0–35)
AST: 21 U/L (ref 0–37)
Albumin: 4.3 g/dL (ref 3.5–5.2)
Alkaline Phosphatase: 125 U/L — ABNORMAL HIGH (ref 39–117)
BUN: 7 mg/dL (ref 6–23)
CO2: 32 mEq/L (ref 19–32)
Calcium: 9.4 mg/dL (ref 8.4–10.5)
Chloride: 103 mEq/L (ref 96–112)
Creatinine, Ser: 0.73 mg/dL (ref 0.40–1.20)
GFR: 107.13 mL/min (ref >60.00)
Glucose, Bld: 115 mg/dL — ABNORMAL HIGH (ref 70–99)
Potassium: 3.6 mEq/L (ref 3.5–5.1)
Sodium: 139 mEq/L (ref 135–145)
Total Bilirubin: 0.2 mg/dL (ref 0.2–1.2)
Total Protein: 8.2 g/dL (ref 6.0–8.3)

## 2022-08-22 LAB — LIPID PANEL
Cholesterol: 143 mg/dL (ref 0–200)
HDL: 23.6 mg/dL — ABNORMAL LOW (ref >39.00)
LDL Cholesterol: 85 mg/dL (ref 0–99)
NonHDL: 119.76
Total CHOL/HDL Ratio: 6
Triglycerides: 176 mg/dL — ABNORMAL HIGH (ref 0.0–149.0)
VLDL: 35.2 mg/dL (ref 0.0–40.0)

## 2022-08-22 LAB — POCT RAPID STREP A (OFFICE): Rapid Strep A Screen: NEGATIVE

## 2022-08-22 LAB — POC COVID19 BINAXNOW: SARS Coronavirus 2 Ag: NEGATIVE

## 2022-08-22 LAB — VITAMIN D 25 HYDROXY (VIT D DEFICIENCY, FRACTURES): VITD: 14.41 ng/mL — ABNORMAL LOW (ref 30.00–100.00)

## 2022-08-22 LAB — HEMOGLOBIN A1C: Hgb A1c MFr Bld: 6.6 % — ABNORMAL HIGH (ref 4.6–6.5)

## 2022-08-22 MED ORDER — CETIRIZINE HCL 10 MG PO TABS: 10.0000 mg | ORAL_TABLET | Freq: Every day | ORAL | 2 refills | Status: DC

## 2022-08-22 MED ORDER — SAXENDA 18 MG/3ML ~~LOC~~ SOPN: PEN_INJECTOR | SUBCUTANEOUS | 5 refills | Status: DC

## 2022-08-22 MED ORDER — BENZONATATE 100 MG PO CAPS: 100.0000 mg | ORAL_CAPSULE | Freq: Two times a day (BID) | ORAL | 0 refills | Status: DC | PRN

## 2022-08-22 NOTE — Progress Notes (Unsigned)
Established Patient Office Visit  Subjective   Patient ID: Tracy Johnson, female    DOB: October 15, 1985  Age: 36 y.o. MRN: [id number]  Chief Complaint  Patient presents with   Establish Care   Cough    Non-productive x2 days, tried Alka Seltzer, hot tea, cough drops and vitamin C    Patient is here with new problem of cough for 2 days and for her transition of care visit.   Prediabetes/ Obesity- pt reports that her weight has remained stable, states she is trying to watch her diet and reduce sugar in her diet. States that she occasionally exercises. She is due for her follow up A1C today. We had a long discussion about her weight loss issues and we discussed that further reducing carbohydrates in her diet will facilitate weight loss. We discussed injectable medications, states she has been on saxenda in the past and it did help. She would like to go back on this medication, states she has been on the 1.8 mg daily dose and tolerated it well.  Iron deficiency anemia -- reviewed last set of labs, pt need follow up lab work, is not currently on iron supplements.   Vitamin D deficiency -- pt finished the previous rx she was given, needs a new level checked today.  Cough This is a new problem. The current episode started yesterday. The problem has been gradually worsening. The problem occurs constantly. The cough is Non-productive. Associated symptoms include headaches. Pertinent negatives include no chest pain, chills, fever or wheezing. She has tried OTC cough suppressant for the symptoms. The treatment provided no relief. There is no history of asthma or COPD.   Current Outpatient Medications  Medication Instructions   benzonatate (TESSALON) 100 mg, Oral, 2 times daily PRN   cetirizine (ZYRTEC) 10 mg, Oral, Daily   Liraglutide -Weight Management (SAXENDA) 18 MG/3ML SOPN Inject 2.4 mg into the skin daily for 28 days, THEN 3 mg daily.   Vitamin D (Ergocalciferol) (DRISDOL) 50,000 Units,  Oral, Every 7 days     Patient Active Problem List   Diagnosis Date Noted   Vitamin D deficiency 10/26/2019   Prediabetes 08/12/2018   Obesity (BMI 30.0-34.9) 08/12/2018   Iron deficiency anemia 07/26/2018   High cholesterol 07/26/2018   Migraines 07/26/2018      Review of Systems  Constitutional:  Negative for chills and fever.  Respiratory:  Positive for cough. Negative for wheezing.   Cardiovascular:  Negative for chest pain.  Neurological:  Positive for headaches.  All other systems reviewed and are negative.     Objective:     BP 130/80 (BP Location: Right Arm, Patient Position: Sitting, Cuff Size: Large)   Pulse 100   Temp 98.4 F (36.9 C) (Oral)   Ht 5' (1.524 m)   Wt 198 lb 6.4 oz (90 kg)   LMP 08/12/2022 (Exact Date)   SpO2 100%   BMI 38.75 kg/m    Physical Exam Vitals reviewed.  Constitutional:      Appearance: Normal appearance. She is well-groomed. She is obese.  HENT:     Right Ear: Tympanic membrane normal.     Left Ear: Tympanic membrane normal.     Mouth/Throat:     Mouth: Mucous membranes are moist.     Pharynx: Oropharynx is clear. Posterior oropharyngeal erythema present.  Eyes:     Conjunctiva/sclera: Conjunctivae normal.  Neck:     Thyroid: No thyromegaly.  Cardiovascular:     Rate and Rhythm: Normal rate  and regular rhythm.     Pulses: Normal pulses.     Heart sounds: S1 normal and S2 normal.  Pulmonary:     Effort: Pulmonary effort is normal.     Breath sounds: Normal breath sounds and air entry.  Abdominal:     General: Bowel sounds are normal.  Musculoskeletal:     Right lower leg: No edema.     Left lower leg: No edema.  Neurological:     Mental Status: She is alert and oriented to person, place, and time. Mental status is at baseline.     Gait: Gait is intact.  Psychiatric:        Mood and Affect: Mood and affect normal.        Speech: Speech normal.        Behavior: Behavior normal.        Judgment: Judgment normal.        The ASCVD Risk score (Arnett DK, et al., 2019) failed to calculate for the following reasons:   The 2019 ASCVD risk score is only valid for ages 2 to 52    Assessment & Plan:   Problem List Items Addressed This Visit       Other   Iron deficiency anemia    Ordered new labs for surveillance, relatively asymptomatic currently.      Relevant Orders   Iron, TIBC and Ferritin Panel (Completed)   Prediabetes    She needs a new A1C today and labs for surveillance. We are continuing the daily Saxenda for weight loss and this will help control her blood sugars as well.       Relevant Orders   Lipid Panel (Completed)   Hemoglobin A1c (Completed)   CMP (Completed)   Obesity (BMI 30.0-34.9)    I have had an extensive 30 minute conversation today with the patient about healthy eating habits, exercise, calorie and carb goals for sustainable and successful weight loss. I gave the patient caloric and protein daily intake values as well as described the importance of increasing fiber and water intake. I discussed weight loss medications that could be used in the treatment of this patient. Handouts on low carb eating were given to the patient.   Saxenda refills sent in to the pharmacy.      Relevant Medications   Liraglutide -Weight Management (SAXENDA) 18 MG/3ML SOPN   Vitamin D deficiency    Needs new labs for surveillance.      Relevant Medications   Vitamin D, Ergocalciferol, (DRISDOL) 1.25 MG (50000 UNIT) CAPS capsule   Other Relevant Orders   Vitamin D, 25-hydroxy (Completed)   Other Visit Diagnoses     Cough, unspecified type    -  Primary   Relevant Medications   Cough is most likely viral and should improve on its own, COVID and strep are negative today. Will rx tessalon perles as needed to control the symptoms.  cetirizine (ZYRTEC) 10 MG tablet   benzonatate (TESSALON) 100 MG capsule   Other Relevant Orders   POC COVID-19 (Completed)   Sore throat        Relevant Orders   POC Rapid Strep A (Completed)       Return in about 3 months (around 11/22/2022).    Karie Georges, MD

## 2022-08-23 LAB — IRON,TIBC AND FERRITIN PANEL
%SAT: 13 % (calc) — ABNORMAL LOW (ref 16–45)
Ferritin: 40 ng/mL (ref 16–154)
Iron: 40 ug/dL (ref 40–190)
TIBC: 305 mcg/dL (calc) (ref 250–450)

## 2022-08-25 MED ORDER — VITAMIN D (ERGOCALCIFEROL) 1.25 MG (50000 UNIT) PO CAPS: 50000.0000 [IU] | ORAL_CAPSULE | ORAL | 1 refills | Status: DC

## 2022-08-25 NOTE — Assessment & Plan Note (Signed)
She needs a new A1C today and labs for surveillance. We are continuing the daily Saxenda for weight loss and this will help control her blood sugars as well.

## 2022-08-25 NOTE — Assessment & Plan Note (Signed)
I have had an extensive 30 minute conversation today with the patient about healthy eating habits, exercise, calorie and carb goals for sustainable and successful weight loss. I gave the patient caloric and protein daily intake values as well as described the importance of increasing fiber and water intake. I discussed weight loss medications that could be used in the treatment of this patient. Handouts on low carb eating were given to the patient.   Saxenda refills sent in to the pharmacy.

## 2022-08-25 NOTE — Assessment & Plan Note (Signed)
Needs new labs for surveillance.

## 2022-08-25 NOTE — Assessment & Plan Note (Addendum)
Ordered new labs for surveillance, relatively asymptomatic currently.

## 2022-09-22 ENCOUNTER — Encounter (HOSPITAL_COMMUNITY): Payer: Self-pay

## 2022-09-22 ENCOUNTER — Emergency Department (HOSPITAL_COMMUNITY): Payer: BC Managed Care – PPO

## 2022-09-22 ENCOUNTER — Other Ambulatory Visit: Payer: Self-pay

## 2022-09-22 ENCOUNTER — Emergency Department (HOSPITAL_COMMUNITY)
Admission: EM | Admit: 2022-09-22 | Discharge: 2022-09-22 | Disposition: A | Discharge status: Home / Self Care | Payer: BC Managed Care – PPO | Attending: Emergency Medicine | Admitting: Emergency Medicine

## 2022-09-22 DIAGNOSIS — X58XXXA Exposure to other specified factors, initial encounter: Secondary | ICD-10-CM | POA: Insufficient documentation

## 2022-09-22 DIAGNOSIS — M7989 Other specified soft tissue disorders: Secondary | ICD-10-CM | POA: Diagnosis not present

## 2022-09-22 DIAGNOSIS — S93402A Sprain of unspecified ligament of left ankle, initial encounter: Secondary | ICD-10-CM | POA: Insufficient documentation

## 2022-09-22 DIAGNOSIS — M79672 Pain in left foot: Secondary | ICD-10-CM | POA: Diagnosis not present

## 2022-09-22 DIAGNOSIS — M778 Other enthesopathies, not elsewhere classified: Secondary | ICD-10-CM | POA: Diagnosis not present

## 2022-09-22 DIAGNOSIS — S93409A Sprain of unspecified ligament of unspecified ankle, initial encounter: Secondary | ICD-10-CM

## 2022-09-22 DIAGNOSIS — M25572 Pain in left ankle and joints of left foot: Secondary | ICD-10-CM | POA: Diagnosis not present

## 2022-09-22 NOTE — ED Triage Notes (Signed)
Pt arrived POV from home c/o left ankle swelling and inner foot swelling with some soreness x a couple days. Pt states the foot has a burning sensation in it but denies any falls, trauma or injuries.

## 2022-09-22 NOTE — Discharge Instructions (Addendum)
Return for any problem.  ?

## 2022-09-22 NOTE — ED Provider Triage Note (Signed)
Emergency Medicine Provider Triage Evaluation Note  Tracy Johnson , a 36 y.o. female  was evaluated in triage.  Pt complains of acute left ankle/foot pain and swelling.  States 2 to 3 days ago symptoms started.  No specific injury, though patient states she has a high pain tolerance and may have twisted it without paying much attention to it.  States pain is worsened with pressure directly on the bottom of the foot or directly to the inside of the ankle.  Denies numbness, tingling, or hx of diabetes.  Notes previously injured this ankle in high school, though did not need surgery at the time.  Denies fever or chills.  Tried icing and wrapping the foot/ankle with some improvement.  Review of Systems  Positive:  Negative: See above  Physical Exam  BP (!) 119/97 (BP Location: Left Arm)   Pulse 87   Temp 97.8 F (36.6 C) (Oral)   Resp 16   Ht 5' (1.524 m)   Wt 86.2 kg   SpO2 98%   BMI 37.11 kg/m  Gen:   Awake, no distress  Resp:  Normal effort  MSK:   Moves extremities without difficulty with the exception of the left ankle.  Tenderness along the medial malleolus and near the central sole of the foot.  No bony tenderness of the calcaneus.  Reproduced with moderate ROM.  PT and DP pulses 2+ bilaterally.  Sensation appears grossly intact.  Compartments soft. Other:  Sitting comfortably  Medical Decision Making  Medically screening exam initiated at 10:50 AM.  Appropriate orders placed.  Tracy Johnson was informed that the remainder of the evaluation will be completed by another provider, this initial triage assessment does not replace that evaluation, and the importance of remaining in the ED until their evaluation is complete.     Cecil Cobbs, PA-C 09/22/22 1053

## 2022-09-22 NOTE — ED Provider Notes (Signed)
[facility] EMERGENCY DEPARTMENT Provider Note   CSN: [id number] Arrival date & time: 09/22/22  0944     History  Chief Complaint  Patient presents with   Ankle Pain    Tracy Johnson is a 36 y.o. female.  36 year old female with complaint of left ankle pain.  She reports that she may have strained her left ankle 2 to 3 days ago.  Patient reports mild pain with ambulation.  She denies other complaint.  Specifically she denies knee pain, calf pain, fever, chills, other complaint.  The history is provided by the patient and medical records.       Home Medications Prior to Admission medications   Medication Sig Start Date End Date Taking? Authorizing Provider  benzonatate (TESSALON) 100 MG capsule Take 1 capsule (100 mg total) by mouth 2 (two) times daily as needed for cough. 08/22/22   Farrel Conners, MD  cetirizine (ZYRTEC) 10 MG tablet Take 1 tablet (10 mg total) by mouth daily. 08/22/22   Farrel Conners, MD  Liraglutide -Weight Management (SAXENDA) 18 MG/3ML SOPN Inject 2.4 mg into the skin daily for 28 days, THEN 3 mg daily. 08/22/22 12/18/22  Farrel Conners, MD  Vitamin D, Ergocalciferol, (DRISDOL) 1.25 MG (50000 UNIT) CAPS capsule Take 1 capsule (50,000 Units total) by mouth every 7 (seven) days. 08/25/22   Farrel Conners, MD      Allergies    Patient has no known allergies.    Review of Systems   Review of Systems  All other systems reviewed and are negative.   Physical Exam Updated Vital Signs BP (!) 119/97 (BP Location: Left Arm)   Pulse 87   Temp 97.8 F (36.6 C) (Oral)   Resp 16   Ht 5' (1.524 m)   Wt 86.2 kg   SpO2 98%   BMI 37.11 kg/m  Physical Exam Vitals and nursing note reviewed.  Constitutional:      General: She is not in acute distress.    Appearance: Normal appearance. She is well-developed.  HENT:     Head: Normocephalic and atraumatic.  Eyes:     Conjunctiva/sclera: Conjunctivae normal.      Pupils: Pupils are equal, round, and reactive to light.  Cardiovascular:     Rate and Rhythm: Normal rate and regular rhythm.     Heart sounds: Normal heart sounds.  Pulmonary:     Effort: Pulmonary effort is normal. No respiratory distress.     Breath sounds: Normal breath sounds.  Abdominal:     General: There is no distension.     Palpations: Abdomen is soft.     Tenderness: There is no abdominal tenderness.  Musculoskeletal:        General: Tenderness present. No deformity. Normal range of motion.     Cervical back: Normal range of motion and neck supple.     Comments: Mild tenderness to the medial malleolus of the left ankle with some pain inferior to the medial malleolus.  No overlying erythema or skin changes.  No significant edema noted.  Distal left lower extremity is neurovascular intact.  Patient is ambulatory with minimal antalgic gait.    Skin:    General: Skin is warm and dry.  Neurological:     General: No focal deficit present.     Mental Status: She is alert and oriented to person, place, and time.     ED Results / Procedures / Treatments   Labs (all labs ordered are  listed, but only abnormal results are displayed) Labs Reviewed - No data to display  EKG None  Radiology DG Foot Complete Left  Result Date: 09/22/2022 CLINICAL DATA:  Left foot pain, swelling EXAM: LEFT FOOT - COMPLETE 3+ VIEW COMPARISON:  09/22/2022 FINDINGS: Ankle and foot soft tissue swelling noted. Normal alignment without acute osseous finding, fracture or significant arthropathy. Enthesopathic change of the Achilles insertion posteriorly on the calcaneus. IMPRESSION: Ankle and foot soft tissue swelling. No acute osseous finding. Electronically Signed   By: Judie Petit.  Shick M.D.   On: 09/22/2022 11:46   DG Ankle Complete Left  Result Date: 09/22/2022 CLINICAL DATA:  Left ankle swelling and pain EXAM: LEFT ANKLE COMPLETE - 3+ VIEW COMPARISON:  09/22/2022 FINDINGS: Diffuse soft tissue swelling.  Normal alignment without acute osseous finding, subluxation or dislocation. Negative for fracture. No joint abnormality or arthropathy. Enthesopathic change of the Achilles insertion posteriorly on the calcaneus. IMPRESSION: Soft tissue swelling without acute osseous finding. Electronically Signed   By: Judie Petit.  Shick M.D.   On: 09/22/2022 11:44    Procedures Procedures    Medications Ordered in ED Medications - No data to display  ED Course/ Medical Decision Making/ A&P                           Medical Decision Making   Medical Screen Complete  This patient presented to the ED with complaint of left ankle pain.  This complaint involves an extensive number of treatment options. The initial differential diagnosis includes, but is not limited to, sprain versus strain versus fracture  This presentation is: Acute, Self-Limited, Previously Undiagnosed, and Uncertain Prognosis  Patient is presenting with complaint of left ankle pain.  On exam patient's pain is localized around the medial malleolus of the left ankle.  X-ray is negative for fracture.  Exam and presentation is consistent with likely ankle sprain.  Ace wrap applied.    Patient understands need for close outpatient follow-up.  Importance of close follow-up is stressed.  Additional history obtained:  External records from outside sources obtained and reviewed including prior ED visits and prior Inpatient records.    Imaging Studies ordered:  I ordered imaging studies including left ankle I independently visualized and interpreted obtained imaging which showed no fracture I agree with the radiologist interpretation.  Problem List / ED Course:  Left ankle strain   Reevaluation:  After the interventions noted above, I reevaluated the patient and found that they have: improved  Disposition:  After consideration of the diagnostic results and the patients response to treatment, I feel that the patent would benefit  from close outpatient follow-up.          Final Clinical Impression(s) / ED Diagnoses Final diagnoses:  Sprain of ankle, unspecified laterality, unspecified ligament, initial encounter    Rx / DC Orders ED Discharge Orders     None         Wynetta Fines, MD 09/22/22 1345

## 2023-03-06 ENCOUNTER — Other Ambulatory Visit: Payer: Self-pay | Admitting: Family Medicine

## 2023-03-06 DIAGNOSIS — E669 Obesity, unspecified: Secondary | ICD-10-CM

## 2023-03-06 MED ORDER — SAXENDA 18 MG/3ML ~~LOC~~ SOPN: 3.0000 mg | PEN_INJECTOR | Freq: Every day | SUBCUTANEOUS | 5 refills | Status: DC

## 2023-08-25 ENCOUNTER — Encounter: Payer: Self-pay | Admitting: Family Medicine

## 2023-08-25 ENCOUNTER — Ambulatory Visit (INDEPENDENT_AMBULATORY_CARE_PROVIDER_SITE_OTHER): Payer: Commercial Managed Care - PPO | Admitting: Family Medicine

## 2023-08-25 VITALS — BP 128/80 | HR 85 | Temp 98.5°F | Ht 61.0 in | Wt 203.7 lb

## 2023-08-25 DIAGNOSIS — E559 Vitamin D deficiency, unspecified: Secondary | ICD-10-CM | POA: Diagnosis not present

## 2023-08-25 DIAGNOSIS — E119 Type 2 diabetes mellitus without complications: Secondary | ICD-10-CM | POA: Diagnosis not present

## 2023-08-25 DIAGNOSIS — Z789 Other specified health status: Secondary | ICD-10-CM | POA: Diagnosis not present

## 2023-08-25 DIAGNOSIS — R7303 Prediabetes: Secondary | ICD-10-CM

## 2023-08-25 DIAGNOSIS — Z23 Encounter for immunization: Secondary | ICD-10-CM | POA: Diagnosis not present

## 2023-08-25 DIAGNOSIS — Z0000 Encounter for general adult medical examination without abnormal findings: Secondary | ICD-10-CM

## 2023-08-25 DIAGNOSIS — D509 Iron deficiency anemia, unspecified: Secondary | ICD-10-CM

## 2023-08-25 LAB — CBC WITH DIFFERENTIAL/PLATELET
Basophils Absolute: 0 10*3/uL (ref 0.0–0.1)
Basophils Relative: 0.4 % (ref 0.0–3.0)
Eosinophils Absolute: 0.1 10*3/uL (ref 0.0–0.7)
Eosinophils Relative: 1.5 % (ref 0.0–5.0)
HCT: 39.4 % (ref 36.0–46.0)
Hemoglobin: 12.7 g/dL (ref 12.0–15.0)
Lymphocytes Relative: 28.9 % (ref 12.0–46.0)
Lymphs Abs: 1.9 10*3/uL (ref 0.7–4.0)
MCHC: 32.3 g/dL (ref 30.0–36.0)
MCV: 83.3 fL (ref 78.0–100.0)
Monocytes Absolute: 0.4 10*3/uL (ref 0.1–1.0)
Monocytes Relative: 5.6 % (ref 3.0–12.0)
Neutro Abs: 4.2 10*3/uL (ref 1.4–7.7)
Neutrophils Relative %: 63.6 % (ref 43.0–77.0)
Platelets: 244 10*3/uL (ref 150.0–400.0)
RBC: 4.73 Mil/uL (ref 3.87–5.11)
RDW: 15.7 % — ABNORMAL HIGH (ref 11.5–15.5)
WBC: 6.6 10*3/uL (ref 4.0–10.5)

## 2023-08-25 LAB — COMPREHENSIVE METABOLIC PANEL
ALT: 28 U/L (ref 0–35)
AST: 23 U/L (ref 0–37)
Albumin: 4.3 g/dL (ref 3.5–5.2)
Alkaline Phosphatase: 118 U/L — ABNORMAL HIGH (ref 39–117)
BUN: 7 mg/dL (ref 6–23)
CO2: 29 meq/L (ref 19–32)
Calcium: 9.4 mg/dL (ref 8.4–10.5)
Chloride: 103 meq/L (ref 96–112)
Creatinine, Ser: 0.73 mg/dL (ref 0.40–1.20)
GFR: 106.37 mL/min (ref >60.00)
Glucose, Bld: 104 mg/dL — ABNORMAL HIGH (ref 70–99)
Potassium: 3.7 meq/L (ref 3.5–5.1)
Sodium: 140 meq/L (ref 135–145)
Total Bilirubin: 0.4 mg/dL (ref 0.2–1.2)
Total Protein: 8.1 g/dL (ref 6.0–8.3)

## 2023-08-25 LAB — HEMOGLOBIN A1C: Hgb A1c MFr Bld: 6.4 % (ref 4.6–6.5)

## 2023-08-25 LAB — TSH: TSH: 2.23 u[IU]/mL (ref 0.35–5.50)

## 2023-08-25 NOTE — Progress Notes (Signed)
Complete physical exam  Patient: Tracy Johnson   DOB: Jun 29, 1986   37 y.o. Female  MRN: [id number]  Subjective:    Chief Complaint  Patient presents with   Annual Exam    Tracy Johnson is a 37 y.o. female who presents today for a complete physical exam. She reports consuming a general diet.  Walking daily  She generally feels well. She reports sleeping well. She does not have additional problems to discuss today.    Most recent fall risk assessment:     No data to display           Most recent depression screenings:    08/25/2023    8:53 AM 08/30/2021    1:50 PM  PHQ 2/9 Scores  PHQ - 2 Score 2 0  PHQ- 9 Score 4 0    Vision:Within last year and Dental: No current dental problems and No regular dental care   Patient Active Problem List   Diagnosis Date Noted   Vitamin D deficiency 10/26/2019   Prediabetes 08/12/2018   Obesity (BMI 30.0-34.9) 08/12/2018   Iron deficiency anemia 07/26/2018   High cholesterol 07/26/2018   Migraines 07/26/2018      Patient Care Team: Karie Georges, MD as PCP - General (Family Medicine) Waynard Reeds, MD as Consulting Physician (Obstetrics and Gynecology)   Outpatient Medications Prior to Visit  Medication Sig   Vitamin D, Ergocalciferol, (DRISDOL) 1.25 MG (50000 UNIT) CAPS capsule Take 1 capsule (50,000 Units total) by mouth every 7 (seven) days.   [DISCONTINUED] benzonatate (TESSALON) 100 MG capsule Take 1 capsule (100 mg total) by mouth 2 (two) times daily as needed for cough.   [DISCONTINUED] cetirizine (ZYRTEC) 10 MG tablet Take 1 tablet (10 mg total) by mouth daily.   [DISCONTINUED] Liraglutide -Weight Management (SAXENDA) 18 MG/3ML SOPN Inject 3 mg into the skin daily.   No facility-administered medications prior to visit.    Review of Systems  HENT:  Negative for hearing loss.   Eyes:  Negative for blurred vision.  Respiratory:  Negative for shortness of breath.   Cardiovascular:  Negative for chest  pain.  Gastrointestinal: Negative.   Genitourinary: Negative.   Musculoskeletal:  Negative for back pain.  Neurological:  Negative for headaches.  Psychiatric/Behavioral:  Negative for depression.        Objective:     BP 128/80 (BP Location: Left Arm, Patient Position: Sitting, Cuff Size: Large)   Pulse 85   Temp 98.5 F (36.9 C) (Oral)   Ht 5\' 1"  (1.549 m)   Wt 203 lb 11.2 oz (92.4 kg)   LMP 07/25/2023   SpO2 100%   BMI 38.49 kg/m    Physical Exam Vitals reviewed.  Constitutional:      Appearance: Normal appearance. She is well-groomed. She is obese.  HENT:     Right Ear: Tympanic membrane and ear canal normal.     Left Ear: Tympanic membrane and ear canal normal.     Mouth/Throat:     Mouth: Mucous membranes are moist.     Pharynx: No posterior oropharyngeal erythema.  Eyes:     Conjunctiva/sclera: Conjunctivae normal.  Neck:     Thyroid: No thyromegaly.  Cardiovascular:     Rate and Rhythm: Normal rate and regular rhythm.     Pulses: Normal pulses.     Heart sounds: S1 normal and S2 normal.  Pulmonary:     Effort: Pulmonary effort is normal.     Breath sounds: Normal  breath sounds and air entry.  Abdominal:     General: Bowel sounds are normal.     Palpations: Abdomen is soft.  Musculoskeletal:     Right lower leg: No edema.     Left lower leg: No edema.  Lymphadenopathy:     Cervical: No cervical adenopathy.  Neurological:     Mental Status: She is alert and oriented to person, place, and time. Mental status is at baseline.     Gait: Gait is intact.  Psychiatric:        Mood and Affect: Mood and affect normal.        Speech: Speech normal.        Behavior: Behavior normal.        Judgment: Judgment normal.      Results for orders placed or performed in visit on 08/25/23  HM PAP SMEAR  Result Value Ref Range   HM Pap smear negative/HPV negative-see report        Assessment & Plan:    Routine Health Maintenance and Physical  Exam  Immunization History  Administered Date(s) Administered   PPD Test 11/07/2020   Tdap 08/25/2023    Health Maintenance  Topic Date Due   INFLUENZA VACCINE  01/11/2024 (Originally 05/14/2023)   Hepatitis C Screening  08/24/2024 (Originally 12/28/2005)   HIV Screening  08/24/2024 (Originally 12/29/2002)   Cervical Cancer Screening (HPV/Pap Cotest)  08/08/2027   DTaP/Tdap/Td (2 - Td or Tdap) 08/24/2033   HPV VACCINES  Aged Out   COVID-19 Vaccine  Discontinued    Discussed health benefits of physical activity, and encouraged her to engage in regular exercise appropriate for her age and condition.  Vitamin D deficiency -     VITAMIN D 25 Hydroxy (Vit-D Deficiency, Fractures); Future  Prediabetes -     Comprehensive metabolic panel -     Hemoglobin A1c -     TSH  Iron deficiency anemia, unspecified iron deficiency anemia type -     Iron, TIBC and Ferritin Panel -     CBC with Differential/Platelet  Unknown varicella vaccination status -     Varicella zoster antibody, IgG  Hepatitis B vaccination status unknown -     Hepatitis B surface antibody,qualitative  Immunization due -     Tdap vaccine greater than or equal to 7yo IM  Routine general medical examination at a health care facility  Normal physical exam findings. I counseled the patient about increasing exercise and I filled out the nursing physical forms she brought with her today. Her immunizations are incomplete, TdaP completed today, checking titers for patient today. I am getting annual labs on her today as well. Last year her A1C was 6.6 and I had recommended continuing the saxenda she was on for weight loss, however she could not afford to continue the prescription.   Return in 3 months (on 11/25/2023).     Karie Georges, MD

## 2023-08-25 NOTE — Patient Instructions (Addendum)
[email or web address]  [email or web address]  [facility]  Health Maintenance, Female Adopting a healthy lifestyle and getting preventive care are important in promoting health and wellness. Ask your health care provider about: The right schedule for you to have regular tests and exams. Things you can do on your own to prevent diseases and keep yourself healthy. What should I know about diet, weight, and exercise? Eat a healthy diet  Eat a diet that includes plenty of vegetables, fruits, low-fat dairy products, and lean protein. Do not eat a lot of foods that are high in solid fats, added sugars, or sodium. Maintain a healthy weight Body mass index (BMI) is used to identify weight problems. It estimates body fat based on height and weight. Your health care provider can help determine your BMI and help you achieve or maintain a healthy weight. Get regular exercise Get regular exercise. This is one of the most important things you can do for your health. Most adults should: Exercise for at least 150 minutes each week. The exercise should increase your heart rate and make you sweat (moderate-intensity exercise). Do strengthening exercises at least twice a week. This is in addition to the moderate-intensity exercise. Spend less time sitting. Even light physical activity can be beneficial. Watch cholesterol and blood lipids Have your blood tested for lipids and cholesterol at 35 years of age, then have this test every 5 years. Have your cholesterol levels checked more often if: Your lipid or cholesterol levels are high. You are older than 35 years of age. You are at high risk for heart disease. What should I know about cancer screening? Depending on your health history and family history, you may need to have cancer screening at various ages. This may include screening for: Breast cancer. Cervical cancer. Colorectal cancer. Skin cancer. Lung cancer. What should I know about heart  disease, diabetes, and high blood pressure? Blood pressure and heart disease High blood pressure causes heart disease and increases the risk of stroke. This is more likely to develop in people who have high blood pressure readings or are overweight. Have your blood pressure checked: Every 3-5 years if you are 30-18 years of age. Every year if you are 23 years old or older. Diabetes Have regular diabetes screenings. This checks your fasting blood sugar level. Have the screening done: Once every three years after age 64 if you are at a normal weight and have a low risk for diabetes. More often and at a younger age if you are overweight or have a high risk for diabetes. What should I know about preventing infection? Hepatitis B If you have a higher risk for hepatitis B, you should be screened for this virus. Talk with your health care provider to find out if you are at risk for hepatitis B infection. Hepatitis C Testing is recommended for: Everyone born from [birth year] through 1963. Anyone with known risk factors for hepatitis C. Sexually transmitted infections (STIs) Get screened for STIs, including gonorrhea and chlamydia, if: You are sexually active and are younger than 35 years of age. You are older than 35 years of age and your health care provider tells you that you are at risk for this type of infection. Your sexual activity has changed since you were last screened, and you are at increased risk for chlamydia or gonorrhea. Ask your health care provider if you are at risk. Ask your health care provider about whether you are at high risk for HIV. Your health care provider  may recommend a prescription medicine to help prevent HIV infection. If you choose to take medicine to prevent HIV, you should first get tested for HIV. You should then be tested every 3 months for as long as you are taking the medicine. Pregnancy If you are about to stop having your period (premenopausal) and you may become  pregnant, seek counseling before you get pregnant. Take 400 to 800 micrograms (mcg) of folic acid every day if you become pregnant. Ask for birth control (contraception) if you want to prevent pregnancy. Osteoporosis and menopause Osteoporosis is a disease in which the bones lose minerals and strength with aging. This can result in bone fractures. If you are 89 years old or older, or if you are at risk for osteoporosis and fractures, ask your health care provider if you should: Be screened for bone loss. Take a calcium or vitamin D supplement to lower your risk of fractures. Be given hormone replacement therapy (HRT) to treat symptoms of menopause. Follow these instructions at home: Alcohol use Do not drink alcohol if: Your health care provider tells you not to drink. You are pregnant, may be pregnant, or are planning to become pregnant. If you drink alcohol: Limit how much you have to: 0-1 drink a day. Know how much alcohol is in your drink. In the [place] one drink equals one 12 oz bottle of beer (355 mL), one 5 oz glass of wine (148 mL), or one 1 oz glass of hard liquor (44 mL). Lifestyle Do not use any products that contain nicotine or tobacco. These products include cigarettes, chewing tobacco, and vaping devices, such as e-cigarettes. If you need help quitting, ask your health care provider. Do not use street drugs. Do not share needles. Ask your health care provider for help if you need support or information about quitting drugs. General instructions Schedule regular health, dental, and eye exams. Stay current with your vaccines. Tell your health care provider if: You often feel depressed. You have ever been abused or do not feel safe at home. Summary Adopting a healthy lifestyle and getting preventive care are important in promoting health and wellness. Follow your health care provider's instructions about healthy diet, exercising, and getting tested or screened for  diseases. Follow your health care provider's instructions on monitoring your cholesterol and blood pressure. This information is not intended to replace advice given to you by your health care provider. Make sure you discuss any questions you have with your health care provider. Document Revised: 02/18/2021 Document Reviewed: 02/18/2021 Elsevier Patient Education  2024 [employer].

## 2023-08-26 MED ORDER — OZEMPIC (0.25 OR 0.5 MG/DOSE) 2 MG/3ML ~~LOC~~ SOPN: PEN_INJECTOR | SUBCUTANEOUS | 2 refills | Status: AC

## 2023-08-26 NOTE — Addendum Note (Signed)
Addended by: Karie Georges on: 08/26/2023 10:14 AM   Modules accepted: Orders

## 2023-08-27 LAB — IRON,TIBC AND FERRITIN PANEL
%SAT: 17 % (ref 16–45)
Ferritin: 36 ng/mL (ref 16–154)
Iron: 50 ug/dL (ref 40–190)
TIBC: 289 ug/dL (ref 250–450)

## 2023-08-27 LAB — HEPATITIS B SURFACE ANTIBODY,QUALITATIVE: Hep B S Ab: NONREACTIVE

## 2023-08-27 LAB — VARICELLA ZOSTER ANTIBODY, IGG: Varicella IgG: 9.32 {s_co_ratio}

## 2023-08-30 ENCOUNTER — Other Ambulatory Visit: Payer: Self-pay | Admitting: Family Medicine

## 2023-08-30 DIAGNOSIS — E559 Vitamin D deficiency, unspecified: Secondary | ICD-10-CM

## 2023-09-03 ENCOUNTER — Ambulatory Visit (INDEPENDENT_AMBULATORY_CARE_PROVIDER_SITE_OTHER): Payer: Commercial Managed Care - PPO | Admitting: *Deleted

## 2023-09-03 DIAGNOSIS — Z23 Encounter for immunization: Secondary | ICD-10-CM

## 2023-10-05 ENCOUNTER — Ambulatory Visit (INDEPENDENT_AMBULATORY_CARE_PROVIDER_SITE_OTHER): Payer: Commercial Managed Care - PPO | Admitting: *Deleted

## 2023-10-05 DIAGNOSIS — Z23 Encounter for immunization: Secondary | ICD-10-CM

## 2023-10-06 ENCOUNTER — Ambulatory Visit: Payer: Commercial Managed Care - PPO | Admitting: Family Medicine

## 2023-11-26 ENCOUNTER — Ambulatory Visit: Payer: Self-pay | Admitting: Family Medicine

## 2023-12-10 ENCOUNTER — Telehealth: Payer: Self-pay | Admitting: *Deleted

## 2023-12-10 NOTE — Telephone Encounter (Signed)
 Copied from CRM [id number]. Topic: General - Other >> Dec 09, 2023  4:57 PM Sonny Dandy B wrote: Reason for CRM: pt called to request her shot record to be uploaded into her chart. Pt states she needs them by Friday, would like to come pick them up. Please call pt back at [phone]

## 2023-12-10 NOTE — Telephone Encounter (Signed)
 Copy from [facility] was left at the front desk for pick up.  Unable to leave a message at the patient's cell number due to voicemail being full.

## 2023-12-16 ENCOUNTER — Encounter: Payer: Self-pay | Admitting: *Deleted

## 2024-04-13 ENCOUNTER — Encounter: Payer: Self-pay | Admitting: Family Medicine

## 2024-04-13 ENCOUNTER — Ambulatory Visit: Admitting: Family Medicine

## 2024-04-13 VITALS — BP 122/84 | HR 85 | Temp 98.2°F | Ht 61.0 in | Wt 198.0 lb

## 2024-04-13 DIAGNOSIS — R7303 Prediabetes: Secondary | ICD-10-CM

## 2024-04-13 DIAGNOSIS — L732 Hidradenitis suppurativa: Secondary | ICD-10-CM

## 2024-04-13 LAB — POCT GLYCOSYLATED HEMOGLOBIN (HGB A1C): Hemoglobin A1C: 5.6 % (ref 4.0–5.6)

## 2024-04-13 MED ORDER — DOXYCYCLINE HYCLATE 100 MG PO TABS: 100.0000 mg | ORAL_TABLET | Freq: Two times a day (BID) | ORAL | 0 refills | Status: AC

## 2024-04-13 NOTE — Patient Instructions (Addendum)
 Dial soap to the armpits and groin once daily  5% or higher benzoyl peroxide cream  Body scrub once a week to get the dead skin cells and debris on the armpits and groin

## 2024-04-13 NOTE — Progress Notes (Signed)
   Acute Office Visit  Subjective:     Patient ID: Tracy Johnson, female    DOB: 1986/02/04, 38 y.o.   MRN: [id number]  Chief Complaint  Patient presents with   Open sore noted left groin area x1 month    HPI Patient is in today for left groin lesion for 1 month. States that it is tender and sore, it is red but not necessarily hot to touch. States that she has HS  and usually flares up around the menstrual cycle. States that she sweats a lot and is worried that it might be a yeast infection or something   PreDM -- A1C today is down to 5.6, pt reports she is ordering compounded semaglutide  through an online pharmacy and this is working well for her. Has lost 5 pounds since her last visit.   Review of Systems  All other systems reviewed and are negative.       Objective:    BP 122/84   Pulse 85   Temp 98.2 F (36.8 C) (Oral)   Ht 5' 1 (1.549 m)   Wt 198 lb (89.8 kg)   LMP 04/06/2024   SpO2 99%   BMI 37.41 kg/m    Physical Exam Vitals reviewed.  Constitutional:      Appearance: Normal appearance. She is obese.  Cardiovascular:     Rate and Rhythm: Normal rate and regular rhythm.  Skin:    Findings: Lesion (left groin there is a raised ,erythematoius and tender nodule in the inguinal area. a small amount of pus was expressed from the lesion as well) present.  Neurological:     Mental Status: She is alert.     Results for orders placed or performed in visit on 04/13/24  POC HgB A1c  Result Value Ref Range   Hemoglobin A1C 5.6 4.0 - 5.6 %   HbA1c POC (<> result, manual entry)     HbA1c, POC (prediabetic range)     HbA1c, POC (controlled diabetic range)          Assessment & Plan:   Problem List Items Addressed This Visit       Unprioritized   Prediabetes - Primary   Relevant Orders   POC HgB A1c (Completed)   Other Visit Diagnoses       Hydradenitis       Relevant Medications   doxycycline  (VIBRA -TABS) 100 MG tablet     A1C is down to 5.6  today. Pt state she is taking compounded semaglutide  for weight loss, states it is working well for her. Will treat the HS with 7 day course of doxycycline , recommended hot compresses also to the area to encourage drainage, nothing to open and drain today  Meds ordered this encounter  Medications   doxycycline  (VIBRA -TABS) 100 MG tablet    Sig: Take 1 tablet (100 mg total) by mouth 2 (two) times daily for 7 days.    Dispense:  14 tablet    Refill:  0    Return in about 4 months (around 08/14/2024).  Heron CHRISTELLA Sharper, MD
